# Patient Record
Sex: Female | Born: 1935 | Race: White | Hispanic: No | Smoking: Former smoker
Health system: Southern US, Community
[De-identification: ages and names within clinical notes are randomized; demographics above are authoritative.]

## PROBLEM LIST (undated history)

## (undated) DIAGNOSIS — E7800 Pure hypercholesterolemia, unspecified: Secondary | ICD-10-CM

## (undated) DIAGNOSIS — K219 Gastro-esophageal reflux disease without esophagitis: Secondary | ICD-10-CM

## (undated) DIAGNOSIS — E039 Hypothyroidism, unspecified: Secondary | ICD-10-CM

## (undated) HISTORY — PX: ABDOMINAL HYSTERECTOMY: SHX81

## (undated) HISTORY — DX: Hypothyroidism, unspecified: E03.9

## (undated) HISTORY — DX: Gastro-esophageal reflux disease without esophagitis: K21.9

## (undated) HISTORY — DX: Pure hypercholesterolemia, unspecified: E78.00

## (undated) HISTORY — PX: PARTIAL HYSTERECTOMY: SHX80

---

## 2001-12-15 ENCOUNTER — Ambulatory Visit (HOSPITAL_COMMUNITY): Admission: RE | Admit: 2001-12-15 | Discharge: 2001-12-15 | Payer: Self-pay | Admitting: Family Medicine

## 2001-12-15 ENCOUNTER — Encounter: Payer: Self-pay | Admitting: Family Medicine

## 2001-12-19 ENCOUNTER — Ambulatory Visit (HOSPITAL_COMMUNITY): Admission: RE | Admit: 2001-12-19 | Discharge: 2001-12-19 | Payer: Self-pay | Admitting: Family Medicine

## 2001-12-19 ENCOUNTER — Encounter: Payer: Self-pay | Admitting: Family Medicine

## 2002-06-29 ENCOUNTER — Ambulatory Visit (HOSPITAL_COMMUNITY): Admission: RE | Admit: 2002-06-29 | Discharge: 2002-06-29 | Payer: Self-pay | Admitting: General Surgery

## 2005-07-23 ENCOUNTER — Ambulatory Visit (HOSPITAL_COMMUNITY): Admission: RE | Admit: 2005-07-23 | Discharge: 2005-07-23 | Payer: Self-pay | Admitting: General Surgery

## 2010-09-14 ENCOUNTER — Telehealth (INDEPENDENT_AMBULATORY_CARE_PROVIDER_SITE_OTHER): Payer: Self-pay | Admitting: *Deleted

## 2010-09-14 DIAGNOSIS — Z86010 Personal history of colonic polyps: Secondary | ICD-10-CM

## 2010-09-14 DIAGNOSIS — Z800 Family history of malignant neoplasm of digestive organs: Secondary | ICD-10-CM

## 2010-09-14 MED ORDER — PEG-KCL-NACL-NASULF-NA ASC-C 100 G PO SOLR: 1.0000 | Freq: Once | ORAL | Status: DC

## 2010-09-14 NOTE — Telephone Encounter (Signed)
TCS sch'd 12/30/10 at 9:30 (8:30), movi prep instructions mailed

## 2010-12-30 ENCOUNTER — Ambulatory Visit: Admit: 2010-12-30 | Payer: Self-pay | Admitting: Internal Medicine

## 2010-12-30 SURGERY — COLONOSCOPY
Anesthesia: Moderate Sedation

## 2015-03-05 ENCOUNTER — Encounter (INDEPENDENT_AMBULATORY_CARE_PROVIDER_SITE_OTHER): Payer: Self-pay | Admitting: *Deleted

## 2015-04-10 ENCOUNTER — Ambulatory Visit (INDEPENDENT_AMBULATORY_CARE_PROVIDER_SITE_OTHER): Payer: Medicare HMO | Admitting: Internal Medicine

## 2015-04-10 ENCOUNTER — Other Ambulatory Visit (INDEPENDENT_AMBULATORY_CARE_PROVIDER_SITE_OTHER): Payer: Self-pay | Admitting: Internal Medicine

## 2015-04-10 ENCOUNTER — Encounter (INDEPENDENT_AMBULATORY_CARE_PROVIDER_SITE_OTHER): Payer: Self-pay | Admitting: Internal Medicine

## 2015-04-10 ENCOUNTER — Encounter (INDEPENDENT_AMBULATORY_CARE_PROVIDER_SITE_OTHER): Payer: Self-pay | Admitting: *Deleted

## 2015-04-10 VITALS — BP 140/70 | HR 72 | Temp 97.7°F | Ht 64.0 in | Wt 136.9 lb

## 2015-04-10 DIAGNOSIS — R1319 Other dysphagia: Secondary | ICD-10-CM

## 2015-04-10 DIAGNOSIS — E039 Hypothyroidism, unspecified: Secondary | ICD-10-CM | POA: Insufficient documentation

## 2015-04-10 DIAGNOSIS — K219 Gastro-esophageal reflux disease without esophagitis: Secondary | ICD-10-CM | POA: Diagnosis not present

## 2015-04-10 DIAGNOSIS — E7800 Pure hypercholesterolemia, unspecified: Secondary | ICD-10-CM | POA: Insufficient documentation

## 2015-04-10 DIAGNOSIS — E038 Other specified hypothyroidism: Secondary | ICD-10-CM

## 2015-04-10 MED ORDER — OMEPRAZOLE 20 MG PO CPDR: 20.0000 mg | DELAYED_RELEASE_CAPSULE | Freq: Every day | ORAL | Status: DC

## 2015-04-10 NOTE — Progress Notes (Addendum)
Subjective:    Patient ID: Tina Mcmillan, female    DOB: 07-Sep-1934, 81 y.o.   MRN: [id number]  HPI Referred by Dr. Quillian Quince for GERD. She tells me she has had reflux for a couple of years.  If she eats Citrus she will have acid reflux. Coffee also bothers her.  No chocolate since February.  Has been taking Omeprazole as needed which helped.  She is presently not taking Omeprazole.  She occasionally has dysphagia. She has trouble with cornbread . Her appetite is good. She has had weight loss. Last year she became sick with reflux.  She says she lost 19 pounds over a period of a year.  She says she likes the weight she lost. BM x 1 a day. No melena or BRRB.   She tells me her esophagus feels small.   11/27/2014 H and H 15.2 and 45.2, MCV 90. Platelet ct 282 Albumin 4.1, total bili 1.1, ALP 95, AST 19, ALT 10    Review of Systems Past Medical History  Diagnosis Date  . Hypothyroid   . GERD (gastroesophageal reflux disease)   . High cholesterol     Past Surgical History  Procedure Laterality Date  . Partial hysterectomy      in her 65s for endometriosis    Allergies  Allergen Reactions  . Nsaids     Messes up her stomach    No current outpatient prescriptions on file prior to visit.   No current facility-administered medications on file prior to visit.   Current Outpatient Prescriptions  Medication Sig Dispense Refill  . Biotin 1 MG CAPS Take by mouth 2 (two) times daily.    . Cholecalciferol (VITAMIN D-3 PO) Take 5,000 Units by mouth every morning.    . clobetasol cream (TEMOVATE) [id number] % Apply 1 application topically.    . cycloSPORINE (RESTASIS) 0.05 % ophthalmic emulsion 1 drop 2 (two) times daily.    . finasteride (PROSCAR) 5 MG tablet Take 2.5 mg by mouth daily. 1/2 tab daily    . levothyroxine (SYNTHROID, LEVOTHROID) 50 MCG tablet Take 50 mcg by mouth daily before breakfast.    . loratadine (CLARITIN) 10 MG tablet Take 10 mg by mouth daily.    . magnesium 30 MG  tablet Take by mouth 2 (two) times daily.    . multivitamin-lutein (OCUVITE-LUTEIN) CAPS capsule Take 1 capsule by mouth daily.    . Omega-3 Fatty Acids (FISH OIL TRIPLE STRENGTH PO) Take by mouth.    . pyridOXINE (B-6) 50 MG tablet Take 100 mg by mouth daily.    . Turmeric 500 MG CAPS Take by mouth 2 (two) times daily.    Marland Kitchen omeprazole (PRILOSEC) 20 MG capsule Take 1 capsule (20 mg total) by mouth daily. 90 capsule 3   No current facility-administered medications for this visit.        Objective:   Physical Exam Blood pressure 140/70, pulse 72, temperature 97.7 F (36.5 C), height 5\' 4"  (1.626 m), weight 136 lb 14.4 oz (62.097 kg). Alert and oriented. Skin warm and dry. Oral mucosa is moist.   . Sclera anicteric, conjunctivae is pink. Thyroid not enlarged. No cervical lymphadenopathy. Lungs clear. Heart regular rate and rhythm.  Abdomen is soft. Bowel sounds are positive. No hepatomegaly. No abdominal masses felt. No tenderness.  No edema to lower extremities.          Assessment & Plan:  GERD: Will start her on Omeprazole 20mg  daily.   Dysphagia: Stricture needs to  be ruled out. EGD/ED.   The risks and benefits such as perforation, bleeding, and infection were reviewed with the patient and is agreeable. GERD diet

## 2015-04-10 NOTE — Patient Instructions (Signed)
EGD/ED. The risks and benefits such as perforation, bleeding, and infection were reviewed with the patient and is agreeable. 

## 2015-05-08 ENCOUNTER — Encounter (HOSPITAL_COMMUNITY): Payer: Self-pay

## 2015-05-08 ENCOUNTER — Ambulatory Visit (HOSPITAL_COMMUNITY)
Admission: RE | Admit: 2015-05-08 | Discharge: 2015-05-08 | Disposition: A | Discharge status: Home / Self Care | Payer: Medicare HMO | Source: Ambulatory Visit | Attending: Internal Medicine | Admitting: Internal Medicine

## 2015-05-08 ENCOUNTER — Encounter (HOSPITAL_COMMUNITY)
Admission: RE | Disposition: A | Discharge status: Home / Self Care | Payer: Self-pay | Source: Ambulatory Visit | Attending: Internal Medicine

## 2015-05-08 DIAGNOSIS — K219 Gastro-esophageal reflux disease without esophagitis: Secondary | ICD-10-CM

## 2015-05-08 DIAGNOSIS — E7800 Pure hypercholesterolemia, unspecified: Secondary | ICD-10-CM | POA: Insufficient documentation

## 2015-05-08 DIAGNOSIS — K449 Diaphragmatic hernia without obstruction or gangrene: Secondary | ICD-10-CM

## 2015-05-08 DIAGNOSIS — K2990 Gastroduodenitis, unspecified, without bleeding: Secondary | ICD-10-CM

## 2015-05-08 DIAGNOSIS — Z87891 Personal history of nicotine dependence: Secondary | ICD-10-CM | POA: Diagnosis not present

## 2015-05-08 DIAGNOSIS — K2970 Gastritis, unspecified, without bleeding: Secondary | ICD-10-CM | POA: Insufficient documentation

## 2015-05-08 DIAGNOSIS — Z79899 Other long term (current) drug therapy: Secondary | ICD-10-CM | POA: Insufficient documentation

## 2015-05-08 DIAGNOSIS — K2960 Other gastritis without bleeding: Secondary | ICD-10-CM | POA: Diagnosis not present

## 2015-05-08 DIAGNOSIS — R1310 Dysphagia, unspecified: Secondary | ICD-10-CM | POA: Diagnosis not present

## 2015-05-08 DIAGNOSIS — R1319 Other dysphagia: Secondary | ICD-10-CM | POA: Insufficient documentation

## 2015-05-08 DIAGNOSIS — E039 Hypothyroidism, unspecified: Secondary | ICD-10-CM | POA: Diagnosis not present

## 2015-05-08 DIAGNOSIS — R1314 Dysphagia, pharyngoesophageal phase: Secondary | ICD-10-CM | POA: Diagnosis not present

## 2015-05-08 HISTORY — PX: ESOPHAGOGASTRODUODENOSCOPY: SHX5428

## 2015-05-08 HISTORY — PX: ESOPHAGEAL DILATION: SHX303

## 2015-05-08 SURGERY — EGD (ESOPHAGOGASTRODUODENOSCOPY)
Anesthesia: Moderate Sedation

## 2015-05-08 MED ORDER — MEPERIDINE HCL 50 MG/ML IJ SOLN
INTRAMUSCULAR | Status: AC
  Filled 2015-05-08: qty 1

## 2015-05-08 MED ORDER — BUTAMBEN-TETRACAINE-BENZOCAINE 2-2-14 % EX AERO
INHALATION_SPRAY | CUTANEOUS | Status: AC
  Filled 2015-05-08: qty 20

## 2015-05-08 MED ORDER — MEPERIDINE HCL 50 MG/ML IJ SOLN
INTRAMUSCULAR | Status: DC | PRN
  Administered 2015-05-08: 25 mg via INTRAVENOUS

## 2015-05-08 MED ORDER — STERILE WATER FOR IRRIGATION IR SOLN
Status: DC | PRN
  Administered 2015-05-08: 4 mL

## 2015-05-08 MED ORDER — MIDAZOLAM HCL 5 MG/5ML IJ SOLN
INTRAMUSCULAR | Status: AC
  Filled 2015-05-08: qty 10

## 2015-05-08 MED ORDER — SODIUM CHLORIDE 0.9 % IV SOLN
INTRAVENOUS | Status: DC
  Administered 2015-05-08: 09:00:00 via INTRAVENOUS

## 2015-05-08 MED ORDER — MIDAZOLAM HCL 5 MG/5ML IJ SOLN
INTRAMUSCULAR | Status: DC | PRN
  Administered 2015-05-08: 1 mg via INTRAVENOUS
  Administered 2015-05-08: 2 mg via INTRAVENOUS
  Administered 2015-05-08: 1 mg via INTRAVENOUS

## 2015-05-08 MED ORDER — BUTAMBEN-TETRACAINE-BENZOCAINE 2-2-14 % EX AERO
INHALATION_SPRAY | CUTANEOUS | Status: DC | PRN
  Administered 2015-05-08: 2 via TOPICAL

## 2015-05-08 NOTE — H&P (Signed)
Tina Mcmillan is an 81 y.o. female.   Chief Complaint: Patient is here for EGD and ED. HPI: 81 year old Caucasian female was at symptoms of GERD for about 3 years. She states that her symptoms started she started to lose weight and lost 18 pounds but her weight has been stable over the last 1 year. She now presents with dysphagia to bread and meats. She's had this intermittently for 1 year. She points to suprasternal area soft bolus obstruction. She has to bring it out before she can eat again. She denies nausea vomiting hematemesis abdominal pain or melena. Her upper GI tract has never been evaluated previously.  Past Medical History  Diagnosis Date  . Hypothyroid   . GERD (gastroesophageal reflux disease)   . High cholesterol     Past Surgical History  Procedure Laterality Date  . Partial hysterectomy      in her 77s for endometriosis  . Abdominal hysterectomy      History reviewed. No pertinent family history. Social History:  reports that she has quit smoking. She has never used smokeless tobacco. She reports that she does not drink alcohol or use illicit drugs.  Allergies:  Allergies  Allergen Reactions  . Nsaids     Messes up her stomach    Medications Prior to Admission  Medication Sig Dispense Refill  . AMINO ACIDS PO Take 1 packet by mouth daily.    . Cholecalciferol (VITAMIN D-3 PO) Take 5,000 Units by mouth every morning.    . clobetasol cream (TEMOVATE) [id number] % Apply 1 application topically.    . cycloSPORINE (RESTASIS) 0.05 % ophthalmic emulsion 1 drop 2 (two) times daily.    . finasteride (PROSCAR) 5 MG tablet Take 2.5 mg by mouth daily. 1/2 tab daily    . gabapentin (NEURONTIN) 100 MG capsule Take 100 mg by mouth at bedtime.    Marland Kitchen levothyroxine (SYNTHROID, LEVOTHROID) 50 MCG tablet Take 50 mcg by mouth daily before breakfast.    . loratadine (CLARITIN) 10 MG tablet Take 10 mg by mouth daily.    . magnesium 30 MG tablet Take by mouth 2 (two) times daily.    .  Multiple Vitamins-Minerals (HAIR SKIN AND NAILS FORMULA PO) Take 2 tablets by mouth daily.    . multivitamin-lutein (OCUVITE-LUTEIN) CAPS capsule Take 1 capsule by mouth daily.    . Omega-3 Fatty Acids (FISH OIL TRIPLE STRENGTH PO) Take by mouth.    Marland Kitchen omeprazole (PRILOSEC) 20 MG capsule Take 1 capsule (20 mg total) by mouth daily. 90 capsule 3  . pyridOXINE (B-6) 50 MG tablet Take 100 mg by mouth daily.    . Turmeric 500 MG CAPS Take by mouth 2 (two) times daily.      No results found for this or any previous visit (from the past 48 hour(s)). No results found.  ROS  Blood pressure 111/58, pulse 69, temperature 98.2 F (36.8 C), temperature source Oral, resp. rate 18, height 5\' 4"  (1.626 m), weight 133 lb (60.328 kg), SpO2 98 %. Physical Exam  Constitutional:  Well-developed thin Caucasian female in NAD.  HENT:  Mouth/Throat: Oropharynx is clear and moist.  Eyes: Conjunctivae are normal. No scleral icterus.  Neck: No thyromegaly present.  Cardiovascular: Normal rate, regular rhythm and normal heart sounds.   No murmur heard. Respiratory: Effort normal and breath sounds normal.  GI: Soft. She exhibits no distension and no mass. There is no tenderness.  Musculoskeletal: She exhibits no edema.  Lymphadenopathy:    She has  no cervical adenopathy.  Neurological: She is alert.  Skin: Skin is warm and dry.     Assessment/Plan Solid food dysphagia in patient with chronic GERD. EGD with ED.  Rogene Houston, MD 05/08/2015, 9:16 AM

## 2015-05-08 NOTE — Discharge Instructions (Signed)
Resume usual medications and diet. No driving for 24 hours. Physician will call with results of blood test.   Esophagogastroduodenoscopy, Care After Refer to this sheet in the next few weeks. These instructions provide you with information about caring for yourself after your procedure. Your health care provider may also give you more specific instructions. Your treatment has been planned according to current medical practices, but problems sometimes occur. Call your health care provider if you have any problems or questions after your procedure. WHAT TO EXPECT AFTER THE PROCEDURE After your procedure, it is typical to feel:  Soreness in your throat.  Pain with swallowing.  Sick to your stomach (nauseous).  Bloated.  Dizzy.  Fatigued. HOME CARE INSTRUCTIONS  Do not eat or drink anything until the numbing medicine (local anesthetic) has worn off and your gag reflex has returned. You will know that the local anesthetic has worn off when you can swallow comfortably.  Do not drive or operate machinery until directed by your health care provider.  Take medicines only as directed by your health care provider. SEEK MEDICAL CARE IF:   You cannot stop coughing.  You are not urinating at all or less than usual. SEEK IMMEDIATE MEDICAL CARE IF:  You have difficulty swallowing.  You cannot eat or drink.  You have worsening throat or chest pain.  You have dizziness or lightheadedness or you faint.  You have nausea or vomiting.  You have chills.  You have a fever.  You have severe abdominal pain.  You have black, tarry, or bloody stools.   This information is not intended to replace advice given to you by your health care provider. Make sure you discuss any questions you have with your health care provider.   Document Released: 01/05/2012 Document Revised: 02/08/2014 Document Reviewed: 01/05/2012 Elsevier Interactive Patient Education [employer].

## 2015-05-08 NOTE — Op Note (Signed)
[facility] Patient Name: Tina Mcmillan Procedure Date: 05/08/2015 8:53 AM MRN: [id number] Date of Birth: [birth year] Attending MD: Hildred Laser , MD CSN: [id number] Age: 81 Admit Type: Outpatient Procedure:                Upper GI endoscopy Indications:              Esophageal dysphagia, Gastro-esophageal reflux                            disease Providers:                Hildred Laser, MD, Rometta Emery, RN, Bonnetta Barry, Technician Referring MD:             Gar Ponto M.D. , MD (Referring MD) Medicines:                Cetacaine spray, Meperidine 25 mg IV, Midazolam 4                            mg IV Complications:            No immediate complications. Estimated Blood Loss:     Estimated blood loss: none. Procedure:                Pre-Anesthesia Assessment:                           - Prior to the procedure, a History and Physical                            was performed, and patient medications and                            allergies were reviewed. The patient's tolerance of                            previous anesthesia was also reviewed. The risks                            and benefits of the procedure and the sedation                            options and risks were discussed with the patient.                            All questions were answered, and informed consent                            was obtained. Prior Anticoagulants: The patient has                            taken no previous anticoagulant or antiplatelet  agents. ASA Grade Assessment: II - A patient with                            mild systemic disease. After reviewing the risks                            and benefits, the patient was deemed in                            satisfactory condition to undergo the procedure.                           After obtaining informed consent, the endoscope was                            passed under direct vision.  Throughout the                            procedure, the patient's blood pressure, pulse, and                            oxygen saturations were monitored continuously. The                            [id number]) scope was introduced through the                            mouth, and advanced to the second part of duodenum.                            The upper GI endoscopy was accomplished without                            difficulty. The patient tolerated the procedure                            well. Scope In: 9:26:48 AM Scope Out: 9:33:36 AM Total Procedure Duration: 0 hours 6 minutes 48 seconds  Findings:      The examined esophagus was normal.      The Z-line was regular and was found 39 cm from the incisors.      A 2 cm hiatal hernia was present.      The cardia, gastric fundus and gastric body were normal.      Patchy mild inflammation characterized by congestion (edema), erythema       and granularity was found in the gastric antrum.      The duodenal bulb and second portion of the duodenum were normal.      There is no endoscopic evidence of [Pertinent Negatives] [Site].      The scope was withdrawn. Dilation was performed in the entire esophagus       with a Maloney dilator with no resistance at 65 Fr. Impression:               - Normal esophagus.                           -  Z-line regular, 39 cm from the incisors.                           - 2 cm hiatal hernia.                           - Normal cardia, gastric fundus and gastric body.                           - Non-erosive gastritis.                           - Normal duodenal bulb and second portion of the                            duodenum.                           - Dilation performed in the entire esophagus.                           - No specimens collected. Moderate Sedation:      Moderate (conscious) sedation was administered by the endoscopy nurse       and supervised by the endoscopist. The following  parameters were       monitored: oxygen saturation, heart rate, blood pressure, CO2       capnography and response to care. Total physician intraservice time was       13 minutes. Recommendation:           - Patient has a contact number available for                            emergencies. The signs and symptoms of potential                            delayed complications were discussed with the                            patient. Return to normal activities tomorrow.                            Written discharge instructions were provided to the                            patient.                           - Resume previous diet today.                           - Continue present medications.                           - Perform an H. pylori serology today. Procedure Code(s):        --- Professional ---  [id number], Esophagogastroduodenoscopy, flexible,                            transoral; diagnostic, including collection of                            specimen(s) by brushing or washing, when performed                            (separate procedure)                           43450, Dilation of esophagus, by unguided sound or                            bougie, single or multiple passes                           99152, Moderate sedation services provided by the                            same physician or other qualified health care                            professional performing the diagnostic or                            therapeutic service that the sedation supports,                            requiring the presence of an independent trained                            observer to assist in the monitoring of the                            patient's level of consciousness and physiological                            status; initial 15 minutes of intraservice time,                            patient age 42 years or older Diagnosis Code(s):        --- Professional  ---                           K44.9, Diaphragmatic hernia without obstruction or                            gangrene                           K29.60, Other gastritis without bleeding                           R13.14, Dysphagia, pharyngoesophageal phase  K21.9, Gastro-esophageal reflux disease without                            esophagitis CPT copyright 2016 American Medical Association. All rights reserved. The codes documented in this report are preliminary and upon coder review may  be revised to meet current compliance requirements. Hildred Laser, MD Hildred Laser, MD 05/08/2015 9:49:19 AM This report has been signed electronically. Number of Addenda: 0

## 2015-05-09 LAB — H. PYLORI ANTIBODY, IGG: H Pylori IgG: 6.5 U/mL — ABNORMAL HIGH (ref 0.0–0.8)

## 2015-05-13 ENCOUNTER — Encounter (HOSPITAL_COMMUNITY): Payer: Self-pay | Admitting: Internal Medicine

## 2015-05-18 ENCOUNTER — Other Ambulatory Visit (INDEPENDENT_AMBULATORY_CARE_PROVIDER_SITE_OTHER): Payer: Self-pay | Admitting: Internal Medicine

## 2015-05-18 MED ORDER — BIS SUBCIT-METRONID-TETRACYC 140-125-125 MG PO CAPS: 3.0000 | ORAL_CAPSULE | Freq: Three times a day (TID) | ORAL | Status: DC

## 2015-05-20 ENCOUNTER — Telehealth (INDEPENDENT_AMBULATORY_CARE_PROVIDER_SITE_OTHER): Payer: Self-pay | Admitting: *Deleted

## 2015-05-20 NOTE — Telephone Encounter (Signed)
Patient was called and made aware that we had rec'd a PA from her Pharmacy. This will be discussed with Dr.Rehman and she will b called tomorrow with his recommendations.

## 2015-05-20 NOTE — Telephone Encounter (Signed)
Dr. Laural Golden had called in some mediation for her.  He told her to call should the medication be too expensive.  Her insurance will not pay any and it was [phone].  [phone]

## 2015-07-14 ENCOUNTER — Encounter (INDEPENDENT_AMBULATORY_CARE_PROVIDER_SITE_OTHER): Payer: Self-pay | Admitting: *Deleted

## 2015-08-04 ENCOUNTER — Other Ambulatory Visit (INDEPENDENT_AMBULATORY_CARE_PROVIDER_SITE_OTHER): Payer: Self-pay | Admitting: *Deleted

## 2015-08-04 DIAGNOSIS — Z1211 Encounter for screening for malignant neoplasm of colon: Secondary | ICD-10-CM

## 2015-08-04 DIAGNOSIS — Z800 Family history of malignant neoplasm of digestive organs: Secondary | ICD-10-CM

## 2015-08-20 ENCOUNTER — Encounter (INDEPENDENT_AMBULATORY_CARE_PROVIDER_SITE_OTHER): Payer: Self-pay | Admitting: *Deleted

## 2015-11-07 ENCOUNTER — Encounter (INDEPENDENT_AMBULATORY_CARE_PROVIDER_SITE_OTHER): Payer: Self-pay | Admitting: *Deleted

## 2015-11-07 ENCOUNTER — Telehealth (INDEPENDENT_AMBULATORY_CARE_PROVIDER_SITE_OTHER): Payer: Self-pay | Admitting: *Deleted

## 2015-11-07 MED ORDER — PEG 3350-KCL-NA BICARB-NACL 420 G PO SOLR: 4000.0000 mL | Freq: Once | ORAL | 0 refills | Status: AC

## 2015-11-07 NOTE — Telephone Encounter (Signed)
Patient needs trilyte 

## 2015-11-18 ENCOUNTER — Telehealth (INDEPENDENT_AMBULATORY_CARE_PROVIDER_SITE_OTHER): Payer: Self-pay | Admitting: *Deleted

## 2015-11-18 NOTE — Telephone Encounter (Signed)
Patient was diagnosed with H pylor back in April was treated with Pylera -- she states her symptoms have come back (reflux, burping and gas) she wants to know if she needs to be tested again -- please advise

## 2015-11-19 NOTE — Telephone Encounter (Signed)
This is for Dr. Rehman 

## 2015-11-19 NOTE — Telephone Encounter (Signed)
To be discussed with Dr.Rehman. 

## 2015-11-20 NOTE — Telephone Encounter (Signed)
Per Dr.Rehman - If the patient has not been on a PPI for 2 weeks will precede with breathe test. If the patient is taking her PPI , and feels that she can be off it for 2 weeks , a breathe test will be ordered. Patient call needed.

## 2015-12-01 ENCOUNTER — Telehealth (INDEPENDENT_AMBULATORY_CARE_PROVIDER_SITE_OTHER): Payer: Self-pay | Admitting: *Deleted

## 2015-12-01 NOTE — Telephone Encounter (Signed)
agree

## 2015-12-01 NOTE — Telephone Encounter (Signed)
Referring MD/PCP: daniel   Procedure: tcs  Reason/Indication:  Screening, fam hx colon ca  Has patient had this procedure before?  Yes, 10 yrs ago  If so, when, by whom and where?    Is there a family history of colon cancer?  Yes, maternal aunt  Who?  What age when diagnosed?    Is patient diabetic?   no      Does patient have prosthetic heart valve or mechanical valve?  no  Do you have a pacemaker?  no  Has patient ever had endocarditis? no  Has patient had joint replacement within last 12 months?  no  Does patient tend to be constipated or take laxatives? some  Does patient have a history of alcohol/drug use?  no  Is patient on Coumadin, Plavix and/or Aspirin? no  Medications: see epic  Allergies: Nsaids  Medication Adjustment:   Procedure date & time: 12/31/15 at 730

## 2015-12-31 ENCOUNTER — Ambulatory Visit (HOSPITAL_COMMUNITY)
Admission: RE | Admit: 2015-12-31 | Discharge: 2015-12-31 | Disposition: A | Discharge status: Home / Self Care | Payer: Medicare HMO | Source: Ambulatory Visit | Attending: Internal Medicine | Admitting: Internal Medicine

## 2015-12-31 ENCOUNTER — Encounter (HOSPITAL_COMMUNITY): Payer: Self-pay

## 2015-12-31 ENCOUNTER — Encounter (HOSPITAL_COMMUNITY)
Admission: RE | Disposition: A | Discharge status: Home / Self Care | Payer: Self-pay | Source: Ambulatory Visit | Attending: Internal Medicine

## 2015-12-31 ENCOUNTER — Other Ambulatory Visit (INDEPENDENT_AMBULATORY_CARE_PROVIDER_SITE_OTHER): Payer: Self-pay | Admitting: Internal Medicine

## 2015-12-31 DIAGNOSIS — Z886 Allergy status to analgesic agent status: Secondary | ICD-10-CM | POA: Insufficient documentation

## 2015-12-31 DIAGNOSIS — K219 Gastro-esophageal reflux disease without esophagitis: Secondary | ICD-10-CM | POA: Diagnosis not present

## 2015-12-31 DIAGNOSIS — Z1211 Encounter for screening for malignant neoplasm of colon: Secondary | ICD-10-CM

## 2015-12-31 DIAGNOSIS — Z9889 Other specified postprocedural states: Secondary | ICD-10-CM | POA: Insufficient documentation

## 2015-12-31 DIAGNOSIS — Z87891 Personal history of nicotine dependence: Secondary | ICD-10-CM | POA: Insufficient documentation

## 2015-12-31 DIAGNOSIS — E039 Hypothyroidism, unspecified: Secondary | ICD-10-CM | POA: Diagnosis not present

## 2015-12-31 DIAGNOSIS — K5730 Diverticulosis of large intestine without perforation or abscess without bleeding: Secondary | ICD-10-CM | POA: Insufficient documentation

## 2015-12-31 DIAGNOSIS — Z90710 Acquired absence of both cervix and uterus: Secondary | ICD-10-CM | POA: Insufficient documentation

## 2015-12-31 DIAGNOSIS — Z79899 Other long term (current) drug therapy: Secondary | ICD-10-CM | POA: Diagnosis not present

## 2015-12-31 DIAGNOSIS — E7800 Pure hypercholesterolemia, unspecified: Secondary | ICD-10-CM | POA: Diagnosis not present

## 2015-12-31 DIAGNOSIS — Z800 Family history of malignant neoplasm of digestive organs: Secondary | ICD-10-CM | POA: Insufficient documentation

## 2015-12-31 DIAGNOSIS — A048 Other specified bacterial intestinal infections: Secondary | ICD-10-CM

## 2015-12-31 HISTORY — PX: COLONOSCOPY: SHX5424

## 2015-12-31 SURGERY — COLONOSCOPY
Anesthesia: Moderate Sedation

## 2015-12-31 MED ORDER — MEPERIDINE HCL 50 MG/ML IJ SOLN
INTRAMUSCULAR | Status: AC
  Filled 2015-12-31: qty 1

## 2015-12-31 MED ORDER — SODIUM CHLORIDE 0.9 % IV SOLN
INTRAVENOUS | Status: DC
  Administered 2015-12-31: 07:00:00 via INTRAVENOUS

## 2015-12-31 MED ORDER — MIDAZOLAM HCL 5 MG/5ML IJ SOLN
INTRAMUSCULAR | Status: DC | PRN
  Administered 2015-12-31: 2 mg via INTRAVENOUS
  Administered 2015-12-31 (×2): 1 mg via INTRAVENOUS

## 2015-12-31 MED ORDER — STERILE WATER FOR IRRIGATION IR SOLN
Status: DC | PRN
  Administered 2015-12-31: 100 mL

## 2015-12-31 MED ORDER — MEPERIDINE HCL 50 MG/ML IJ SOLN
INTRAMUSCULAR | Status: DC | PRN
  Administered 2015-12-31 (×2): 25 mg via INTRAVENOUS

## 2015-12-31 MED ORDER — MIDAZOLAM HCL 5 MG/5ML IJ SOLN
INTRAMUSCULAR | Status: AC
  Filled 2015-12-31: qty 10

## 2015-12-31 NOTE — Discharge Instructions (Signed)
High-Fiber Diet Fiber, also called dietary fiber, is a type of carbohydrate found in fruits, vegetables, whole grains, and beans. A high-fiber diet can have many health benefits. Your health care provider may recommend a high-fiber diet to help:  Prevent constipation. Fiber can make your bowel movements more regular.  Lower your cholesterol.  Relieve hemorrhoids, uncomplicated diverticulosis, or irritable bowel syndrome.  Prevent overeating as part of a weight-loss plan.  Prevent heart disease, type 2 diabetes, and certain cancers. What is my plan? The recommended daily intake of fiber includes:  38 grams for men under age 6.  64 grams for men over age 17.  69 grams for women under age 52.  31 grams for women over age 10. You can get the recommended daily intake of dietary fiber by eating a variety of fruits, vegetables, grains, and beans. Your health care provider may also recommend a fiber supplement if it is not possible to get enough fiber through your diet. What do I need to know about a high-fiber diet?  Fiber supplements have not been widely studied for their effectiveness, so it is better to get fiber through food sources.  Always check the fiber content on thenutrition facts label of any prepackaged food. Look for foods that contain at least 5 grams of fiber per serving.  Ask your dietitian if you have questions about specific foods that are related to your condition, especially if those foods are not listed in the following section.  Increase your daily fiber consumption gradually. Increasing your intake of dietary fiber too quickly may cause bloating, cramping, or gas.  Drink plenty of water. Water helps you to digest fiber. What foods can I eat? Grains  Whole-grain breads. Multigrain cereal. Oats and oatmeal. Brown rice. Barley. Bulgur wheat. [place]. Bran muffins. Popcorn. Rye wafer crackers. Vegetables  Sweet potatoes. Spinach. Kale. Artichokes. Cabbage. Broccoli.  Green peas. Carrots. Squash. Fruits  Berries. Pears. Apples. Oranges. Avocados. Prunes and raisins. Dried figs. Meats and Other Protein Sources  [place], kidney, pinto, and soy beans. Split peas. Lentils. Nuts and seeds. Dairy  Fiber-fortified yogurt. Beverages  Fiber-fortified soy milk. Fiber-fortified orange juice. Other  Fiber bars. The items listed above may not be a complete list of recommended foods or beverages. Contact your dietitian for more options.  What foods are not recommended? Grains  White bread. Pasta made with refined flour. White rice. Vegetables  Fried potatoes. Canned vegetables. Well-cooked vegetables. Fruits  Fruit juice. Cooked, strained fruit. Meats and Other Protein Sources  Fatty cuts of meat. Fried Sales executive or fried fish. Dairy  Milk. Yogurt. Cream cheese. Sour cream. Beverages  Soft drinks. Other  Cakes and pastries. Butter and oils. The items listed above may not be a complete list of foods and beverages to avoid. Contact your dietitian for more information.  What are some tips for including high-fiber foods in my diet?  Eat a wide variety of high-fiber foods.  Make sure that half of all grains consumed each day are whole grains.  Replace breads and cereals made from refined flour or white flour with whole-grain breads and cereals.  Replace white rice with brown rice, bulgur wheat, or millet.  Start the day with a breakfast that is high in fiber, such as a cereal that contains at least 5 grams of fiber per serving.  Use beans in place of meat in soups, salads, or pasta.  Eat high-fiber snacks, such as berries, raw vegetables, nuts, or popcorn. This information is not intended to replace  advice given to you by your health care provider. Make sure you discuss any questions you have with your health care provider. Document Released: 01/18/2005 Document Revised: 06/26/2015 Document Reviewed: 07/03/2013 Elsevier Interactive Patient Education  2017  [place]. Colonoscopy, Adult, Care After This sheet gives you information about how to care for yourself after your procedure. Your health care provider may also give you more specific instructions. If you have problems or questions, contact your health care provider. What can I expect after the procedure? After the procedure, it is common to have:  A small amount of blood in your stool for 24 hours after the procedure.  Some gas.  Mild abdominal cramping or bloating. Follow these instructions at home: General instructions  For the first 24 hours after the procedure:  Do not drive or use machinery.  Do not sign important documents.  Do not drink alcohol.  Do your regular daily activities at a slower pace than normal.  Eat soft, easy-to-digest foods.  Rest often.  Take over-the-counter or prescription medicines only as told by your health care provider.  It is up to you to get the results of your procedure. Ask your health care provider, or the department performing the procedure, when your results will be ready. Relieving cramping and bloating  Try walking around when you have cramps or feel bloated.  Apply heat to your abdomen as told by your health care provider. Use a heat source that your health care provider recommends, such as a moist heat pack or a heating pad.  Place a towel between your skin and the heat source.  Leave the heat on for 20-30 minutes.  Remove the heat if your skin turns bright red. This is especially important if you are unable to feel pain, heat, or cold. You may have a greater risk of getting burned. Eating and drinking  Drink enough fluid to keep your urine clear or pale yellow.  Resume your normal diet as instructed by your health care provider. Avoid heavy or fried foods that are hard to digest.  Avoid drinking alcohol for as long as instructed by your health care provider. Contact a health care provider if:  You have blood in your  stool 2-3 days after the procedure. Get help right away if:  You have more than a small spotting of blood in your stool.  You pass large blood clots in your stool.  Your abdomen is swollen.  You have nausea or vomiting.  You have a fever.  You have increasing abdominal pain that is not relieved with medicine. This information is not intended to replace advice given to you by your health care provider. Make sure you discuss any questions you have with your health care provider. Document Released: 09/02/2003 Document Revised: 10/13/2015 Document Reviewed: 04/01/2015 Elsevier Interactive Patient Education  2017 [place] usual medications and high fiber diet. No driving for 24 hours.

## 2015-12-31 NOTE — Op Note (Signed)
[facility] Patient Name: Tina Mcmillan Procedure Date: 12/31/2015 7:36 AM MRN: [id number] Date of Birth: 08/01/1934 Attending MD: Hildred Laser , MD CSN: [id number] Age: 81 Admit Type: Outpatient Procedure:                Colonoscopy Indications:              Screening for colorectal malignant neoplasm Providers:                Hildred Laser, MD, Rosina Lowenstein, RN, Purcell Nails.                            Addis, Merchant [employer] officer Referring MD:             Mitzie Na. Quillian Quince, MD Medicines:                Meperidine 50 mg IV, Midazolam 4 mg IV Complications:            No immediate complications. Estimated Blood Loss:     Estimated blood loss: none. Procedure:                Pre-Anesthesia Assessment:                           - Prior to the procedure, a History and Physical                            was performed, and patient medications and                            allergies were reviewed. The patient's tolerance of                            previous anesthesia was also reviewed. The risks                            and benefits of the procedure and the sedation                            options and risks were discussed with the patient.                            All questions were answered, and informed consent                            was obtained. Prior Anticoagulants: The patient has                            taken no previous anticoagulant or antiplatelet                            agents. ASA Grade Assessment: II - A patient with                            mild systemic disease. After reviewing the risks  and benefits, the patient was deemed in                            satisfactory condition to undergo the procedure.                           After obtaining informed consent, the colonoscope                            was passed under direct vision. Throughout the                            procedure, the patient's blood pressure, pulse, and                             oxygen saturations were monitored continuously. The                            [id number] scope was introduced through                            the anus and advanced to the the cecum, identified                            by appendiceal orifice and ileocecal valve. The                            colonoscopy was performed without difficulty. The                            patient tolerated the procedure well. The quality                            of the bowel preparation was excellent. The                            ileocecal valve, appendiceal orifice, and rectum                            were photographed. Scope In: 7:43:16 AM Scope Out: 8:02:23 AM Scope Withdrawal Time: 0 hours 7 minutes 7 seconds  Total Procedure Duration: 0 hours 19 minutes 7 seconds  Findings:      A few medium-mouthed diverticula were found in the sigmoid colon.      The exam was otherwise normal throughout the examined colon.      The retroflexed view of the distal rectum and anal verge was normal and       showed no anal or rectal abnormalities. Impression:               - Diverticulosis in the sigmoid colon.                           - The distal rectum and anal verge are normal on  retroflexion view.                           - No specimens collected. Moderate Sedation:      Moderate (conscious) sedation was administered by the endoscopy nurse       and supervised by the endoscopist. The following parameters were       monitored: oxygen saturation, heart rate, blood pressure, CO2       capnography and response to care. Total physician intraservice time was       25 minutes. Recommendation:           - Patient has a contact number available for                            emergencies. The signs and symptoms of potential                            delayed complications were discussed with the                            patient. Return to normal activities  tomorrow.                            Written discharge instructions were provided to the                            patient.                           - High fiber diet today.                           - Continue present medications.                           - No repeat colonoscopy due to age and the absence                            of advanced adenomas. Procedure Code(s):        --- Professional ---                           [phone], Colonoscopy, flexible; diagnostic, including                            collection of specimen(s) by brushing or washing,                            when performed (separate procedure)                           [id number], Moderate sedation services provided by the                            same physician or other qualified health care  professional performing the diagnostic or                            therapeutic service that the sedation supports,                            requiring the presence of an independent trained                            observer to assist in the monitoring of the                            patient's level of consciousness and physiological                            status; initial 15 minutes of intraservice time,                            patient age 61 years or older                           [phone], Moderate sedation services; each additional                            15 minutes intraservice time Diagnosis Code(s):        --- Professional ---                           Z12.11, Encounter for screening for malignant                            neoplasm of colon                           K57.30, Diverticulosis of large intestine without                            perforation or abscess without bleeding CPT copyright 2016 American [employer] Association. All rights reserved. The codes documented in this report are preliminary and upon coder review may  be revised to meet current compliance  requirements. Hildred Laser, MD Hildred Laser, MD 12/31/2015 8:16:15 AM This report has been signed electronically. Number of Addenda: 0

## 2015-12-31 NOTE — H&P (Signed)
Tina Mcmillan is an 81 y.o. female.   Chief Complaint: Patient is here for colonoscopy. HPI: Patient is 81 year old Caucasian female who is here for screening colonoscopy. Last exam was about 10 years ago. She denies abdominal pain change in bowel habits or rectal bleeding. Family history significant for CRC in maternal aunt who was in her early 31s. She doesn't have first degree relative with CRC.   Past Medical History:  Diagnosis Date  . GERD (gastroesophageal reflux disease)   . High cholesterol   . Hypothyroid     Past Surgical History:  Procedure Laterality Date  . ABDOMINAL HYSTERECTOMY    . ESOPHAGEAL DILATION N/A 05/08/2015   Procedure: ESOPHAGEAL DILATION;  Surgeon: Rogene Houston, MD;  Location: [facility];  Service: Endoscopy;  Laterality: N/A;  . ESOPHAGOGASTRODUODENOSCOPY N/A 05/08/2015   Procedure: ESOPHAGOGASTRODUODENOSCOPY (EGD);  Surgeon: Rogene Houston, MD;  Location: [facility];  Service: Endoscopy;  Laterality: N/A;  9:00  . PARTIAL HYSTERECTOMY     in her 56s for endometriosis    History reviewed. No pertinent family history. Social History:  reports that she has quit smoking. She has never used smokeless tobacco. She reports that she does not drink alcohol or use drugs.  Allergies:  Allergies  Allergen Reactions  . Nsaids     Messes up her stomach    Medications Prior to Admission  Medication Sig Dispense Refill  . Carboxymethylcellulose Sodium (REFRESH PLUS OP) Place 1 drop into both eyes daily.    . Cholecalciferol (VITAMIN D-3 PO) Take 5,000 Units by mouth every morning.    . clobetasol cream (TEMOVATE) [id number] % Apply 1 application topically.    . cycloSPORINE (RESTASIS) 0.05 % ophthalmic emulsion Place 1 drop into both eyes 2 (two) times daily.     . finasteride (PROSCAR) 5 MG tablet Take 2.5 mg by mouth daily. 1/2 tab daily    . gabapentin (NEURONTIN) 100 MG capsule Take 100 mg by mouth at bedtime.    Marland Kitchen levothyroxine (SYNTHROID, LEVOTHROID) 50  MCG tablet Take 50 mcg by mouth daily before breakfast.    . loratadine (CLARITIN) 10 MG tablet Take 10 mg by mouth daily.    . magnesium 30 MG tablet Take 30 mg by mouth 2 (two) times daily.     . Multiple Vitamins-Minerals (HAIR SKIN AND NAILS FORMULA PO) Take 2 tablets by mouth daily.    . multivitamin-lutein (OCUVITE-LUTEIN) CAPS capsule Take 1 capsule by mouth daily.    . Omega-3 Fatty Acids (FISH OIL TRIPLE STRENGTH PO) Take 2 capsules by mouth daily.     . Turmeric 500 MG CAPS Take 1 capsule by mouth 3 (three) times daily.     Marland Kitchen VITAMIN K PO Take 1 tablet by mouth daily.    Marland Kitchen omeprazole (PRILOSEC) 20 MG capsule Take 1 capsule (20 mg total) by mouth daily. 90 capsule 3    No results found for this or any previous visit (from the past 48 hour(s)). No results found.  ROS  Blood pressure (!) 142/97, pulse 63, temperature 98.5 F (36.9 C), temperature source Oral, resp. rate 19, height 5\' 4"  (1.626 m), weight 130 lb (59 kg), SpO2 92 %. Physical Exam  Constitutional: She appears well-developed and well-nourished.  HENT:  Mouth/Throat: Oropharynx is clear and moist.  Eyes: Conjunctivae are normal. No scleral icterus.  Neck: No thyromegaly present.  Cardiovascular: Normal rate, regular rhythm and normal heart sounds.   No murmur heard. Respiratory: Effort normal and breath  sounds normal.  GI: Soft. She exhibits no distension and no mass. There is no tenderness.  Musculoskeletal: She exhibits no edema.  Lymphadenopathy:    She has no cervical adenopathy.  Neurological: She is alert.  Skin: Skin is warm and dry.     Assessment/Plan Average risk screening colonoscopy. CRC and second-degree relative.  Hildred Laser, MD 12/31/2015, 7:33 AM

## 2016-01-01 ENCOUNTER — Encounter (HOSPITAL_COMMUNITY)
Admit: 2016-01-01 | Discharge: 2016-01-01 | Disposition: A | Discharge status: Home / Self Care | Payer: Medicare HMO | Source: Ambulatory Visit | Attending: Internal Medicine | Admitting: Internal Medicine

## 2016-01-01 ENCOUNTER — Encounter (HOSPITAL_COMMUNITY): Payer: Self-pay

## 2016-01-01 DIAGNOSIS — A048 Other specified bacterial intestinal infections: Secondary | ICD-10-CM | POA: Diagnosis not present

## 2016-01-01 MED ORDER — CARBON-14 CAPSULE
1.0000 | ORAL_CAPSULE | Freq: Once | ORAL | Status: AC
  Administered 2016-01-01: 1 via ORAL

## 2016-01-02 ENCOUNTER — Encounter (HOSPITAL_COMMUNITY): Payer: Self-pay | Admitting: Internal Medicine

## 2016-02-05 DIAGNOSIS — H1851 Endothelial corneal dystrophy: Secondary | ICD-10-CM | POA: Diagnosis not present

## 2016-02-23 DIAGNOSIS — Z780 Asymptomatic menopausal state: Secondary | ICD-10-CM | POA: Diagnosis not present

## 2016-02-23 DIAGNOSIS — Z8262 Family history of osteoporosis: Secondary | ICD-10-CM | POA: Diagnosis not present

## 2016-02-23 DIAGNOSIS — Z79899 Other long term (current) drug therapy: Secondary | ICD-10-CM | POA: Diagnosis not present

## 2016-02-23 DIAGNOSIS — E039 Hypothyroidism, unspecified: Secondary | ICD-10-CM | POA: Diagnosis not present

## 2016-02-23 DIAGNOSIS — M85852 Other specified disorders of bone density and structure, left thigh: Secondary | ICD-10-CM | POA: Diagnosis not present

## 2016-02-23 DIAGNOSIS — M810 Age-related osteoporosis without current pathological fracture: Secondary | ICD-10-CM | POA: Diagnosis not present

## 2016-02-23 DIAGNOSIS — M1990 Unspecified osteoarthritis, unspecified site: Secondary | ICD-10-CM | POA: Diagnosis not present

## 2016-06-01 DIAGNOSIS — M859 Disorder of bone density and structure, unspecified: Secondary | ICD-10-CM | POA: Diagnosis not present

## 2016-06-01 DIAGNOSIS — L658 Other specified nonscarring hair loss: Secondary | ICD-10-CM | POA: Diagnosis not present

## 2016-06-01 DIAGNOSIS — F5104 Psychophysiologic insomnia: Secondary | ICD-10-CM | POA: Diagnosis not present

## 2016-06-01 DIAGNOSIS — R252 Cramp and spasm: Secondary | ICD-10-CM | POA: Diagnosis not present

## 2016-06-01 DIAGNOSIS — K222 Esophageal obstruction: Secondary | ICD-10-CM | POA: Diagnosis not present

## 2016-06-01 DIAGNOSIS — E038 Other specified hypothyroidism: Secondary | ICD-10-CM | POA: Diagnosis not present

## 2016-06-01 DIAGNOSIS — E784 Other hyperlipidemia: Secondary | ICD-10-CM | POA: Diagnosis not present

## 2016-06-01 DIAGNOSIS — R209 Unspecified disturbances of skin sensation: Secondary | ICD-10-CM | POA: Diagnosis not present

## 2016-06-01 DIAGNOSIS — K219 Gastro-esophageal reflux disease without esophagitis: Secondary | ICD-10-CM | POA: Diagnosis not present

## 2016-07-20 DIAGNOSIS — L570 Actinic keratosis: Secondary | ICD-10-CM | POA: Diagnosis not present

## 2016-07-20 DIAGNOSIS — L659 Nonscarring hair loss, unspecified: Secondary | ICD-10-CM | POA: Diagnosis not present

## 2016-07-20 DIAGNOSIS — Z85828 Personal history of other malignant neoplasm of skin: Secondary | ICD-10-CM | POA: Diagnosis not present

## 2016-09-17 DIAGNOSIS — H16223 Keratoconjunctivitis sicca, not specified as Sjogren's, bilateral: Secondary | ICD-10-CM | POA: Diagnosis not present

## 2016-09-17 DIAGNOSIS — Z961 Presence of intraocular lens: Secondary | ICD-10-CM | POA: Diagnosis not present

## 2016-10-18 DIAGNOSIS — Z961 Presence of intraocular lens: Secondary | ICD-10-CM | POA: Diagnosis not present

## 2016-10-18 DIAGNOSIS — H16223 Keratoconjunctivitis sicca, not specified as Sjogren's, bilateral: Secondary | ICD-10-CM | POA: Diagnosis not present

## 2016-11-03 DIAGNOSIS — Z23 Encounter for immunization: Secondary | ICD-10-CM | POA: Diagnosis not present

## 2016-11-15 DIAGNOSIS — H26493 Other secondary cataract, bilateral: Secondary | ICD-10-CM | POA: Diagnosis not present

## 2016-11-15 DIAGNOSIS — H1851 Endothelial corneal dystrophy: Secondary | ICD-10-CM | POA: Diagnosis not present

## 2016-11-15 DIAGNOSIS — H01029 Squamous blepharitis unspecified eye, unspecified eyelid: Secondary | ICD-10-CM | POA: Diagnosis not present

## 2016-11-15 DIAGNOSIS — Z961 Presence of intraocular lens: Secondary | ICD-10-CM | POA: Diagnosis not present

## 2016-11-15 DIAGNOSIS — H16223 Keratoconjunctivitis sicca, not specified as Sjogren's, bilateral: Secondary | ICD-10-CM | POA: Diagnosis not present

## 2016-11-30 DIAGNOSIS — Z6822 Body mass index (BMI) 22.0-22.9, adult: Secondary | ICD-10-CM | POA: Diagnosis not present

## 2016-11-30 DIAGNOSIS — F5104 Psychophysiologic insomnia: Secondary | ICD-10-CM | POA: Diagnosis not present

## 2016-11-30 DIAGNOSIS — E7849 Other hyperlipidemia: Secondary | ICD-10-CM | POA: Diagnosis not present

## 2016-11-30 DIAGNOSIS — G6289 Other specified polyneuropathies: Secondary | ICD-10-CM | POA: Diagnosis not present

## 2016-11-30 DIAGNOSIS — M859 Disorder of bone density and structure, unspecified: Secondary | ICD-10-CM | POA: Diagnosis not present

## 2016-11-30 DIAGNOSIS — K222 Esophageal obstruction: Secondary | ICD-10-CM | POA: Diagnosis not present

## 2016-11-30 DIAGNOSIS — E038 Other specified hypothyroidism: Secondary | ICD-10-CM | POA: Diagnosis not present

## 2016-11-30 DIAGNOSIS — K219 Gastro-esophageal reflux disease without esophagitis: Secondary | ICD-10-CM | POA: Diagnosis not present

## 2016-12-02 ENCOUNTER — Other Ambulatory Visit: Payer: Self-pay | Admitting: Endocrinology

## 2016-12-02 DIAGNOSIS — Z1231 Encounter for screening mammogram for malignant neoplasm of breast: Secondary | ICD-10-CM

## 2016-12-06 ENCOUNTER — Ambulatory Visit
Admission: RE | Admit: 2016-12-06 | Discharge: 2016-12-06 | Disposition: A | Discharge status: Home / Self Care | Payer: Medicare HMO | Source: Ambulatory Visit | Attending: Endocrinology | Admitting: Endocrinology

## 2016-12-06 DIAGNOSIS — Z1231 Encounter for screening mammogram for malignant neoplasm of breast: Secondary | ICD-10-CM | POA: Diagnosis not present

## 2017-05-25 DIAGNOSIS — Z131 Encounter for screening for diabetes mellitus: Secondary | ICD-10-CM | POA: Diagnosis not present

## 2017-05-25 DIAGNOSIS — R82998 Other abnormal findings in urine: Secondary | ICD-10-CM | POA: Diagnosis not present

## 2017-05-25 DIAGNOSIS — M859 Disorder of bone density and structure, unspecified: Secondary | ICD-10-CM | POA: Diagnosis not present

## 2017-05-25 DIAGNOSIS — E038 Other specified hypothyroidism: Secondary | ICD-10-CM | POA: Diagnosis not present

## 2017-06-01 DIAGNOSIS — Z0000 Encounter for general adult medical examination without abnormal findings: Secondary | ICD-10-CM | POA: Diagnosis not present

## 2017-06-01 DIAGNOSIS — F5104 Psychophysiologic insomnia: Secondary | ICD-10-CM | POA: Diagnosis not present

## 2017-06-01 DIAGNOSIS — K222 Esophageal obstruction: Secondary | ICD-10-CM | POA: Diagnosis not present

## 2017-06-01 DIAGNOSIS — E038 Other specified hypothyroidism: Secondary | ICD-10-CM | POA: Diagnosis not present

## 2017-06-01 DIAGNOSIS — J309 Allergic rhinitis, unspecified: Secondary | ICD-10-CM | POA: Diagnosis not present

## 2017-06-01 DIAGNOSIS — E7849 Other hyperlipidemia: Secondary | ICD-10-CM | POA: Diagnosis not present

## 2017-06-01 DIAGNOSIS — K219 Gastro-esophageal reflux disease without esophagitis: Secondary | ICD-10-CM | POA: Diagnosis not present

## 2017-06-01 DIAGNOSIS — R7301 Impaired fasting glucose: Secondary | ICD-10-CM | POA: Diagnosis not present

## 2017-06-01 DIAGNOSIS — M8580 Other specified disorders of bone density and structure, unspecified site: Secondary | ICD-10-CM | POA: Diagnosis not present

## 2017-06-03 DIAGNOSIS — Z1212 Encounter for screening for malignant neoplasm of rectum: Secondary | ICD-10-CM | POA: Diagnosis not present

## 2017-07-12 DIAGNOSIS — D2371 Other benign neoplasm of skin of right lower limb, including hip: Secondary | ICD-10-CM | POA: Diagnosis not present

## 2017-07-12 DIAGNOSIS — L821 Other seborrheic keratosis: Secondary | ICD-10-CM | POA: Diagnosis not present

## 2017-07-12 DIAGNOSIS — D1801 Hemangioma of skin and subcutaneous tissue: Secondary | ICD-10-CM | POA: Diagnosis not present

## 2017-07-12 DIAGNOSIS — L570 Actinic keratosis: Secondary | ICD-10-CM | POA: Diagnosis not present

## 2017-09-29 DIAGNOSIS — H5203 Hypermetropia, bilateral: Secondary | ICD-10-CM | POA: Diagnosis not present

## 2017-09-29 DIAGNOSIS — H1851 Endothelial corneal dystrophy: Secondary | ICD-10-CM | POA: Diagnosis not present

## 2017-09-29 DIAGNOSIS — Z961 Presence of intraocular lens: Secondary | ICD-10-CM | POA: Diagnosis not present

## 2017-09-29 DIAGNOSIS — H04123 Dry eye syndrome of bilateral lacrimal glands: Secondary | ICD-10-CM | POA: Diagnosis not present

## 2017-10-11 DIAGNOSIS — M545 Low back pain: Secondary | ICD-10-CM | POA: Diagnosis not present

## 2017-10-11 DIAGNOSIS — Z6823 Body mass index (BMI) 23.0-23.9, adult: Secondary | ICD-10-CM | POA: Diagnosis not present

## 2017-10-14 ENCOUNTER — Other Ambulatory Visit: Payer: Self-pay | Admitting: Endocrinology

## 2017-10-14 ENCOUNTER — Other Ambulatory Visit: Payer: Self-pay | Admitting: Registered Nurse

## 2017-10-14 DIAGNOSIS — M545 Low back pain: Secondary | ICD-10-CM

## 2017-10-21 ENCOUNTER — Inpatient Hospital Stay
Admission: RE | Admit: 2017-10-21 | Discharge: 2017-10-21 | Disposition: A | Discharge status: Home / Self Care | Payer: Medicare HMO | Source: Ambulatory Visit | Attending: Endocrinology | Admitting: Endocrinology

## 2017-10-26 DIAGNOSIS — R278 Other lack of coordination: Secondary | ICD-10-CM | POA: Diagnosis not present

## 2017-10-26 DIAGNOSIS — M545 Low back pain: Secondary | ICD-10-CM | POA: Diagnosis not present

## 2017-10-26 DIAGNOSIS — M6281 Muscle weakness (generalized): Secondary | ICD-10-CM | POA: Diagnosis not present

## 2017-10-28 DIAGNOSIS — R278 Other lack of coordination: Secondary | ICD-10-CM | POA: Diagnosis not present

## 2017-10-28 DIAGNOSIS — M545 Low back pain: Secondary | ICD-10-CM | POA: Diagnosis not present

## 2017-10-28 DIAGNOSIS — M6281 Muscle weakness (generalized): Secondary | ICD-10-CM | POA: Diagnosis not present

## 2017-11-01 DIAGNOSIS — R278 Other lack of coordination: Secondary | ICD-10-CM | POA: Diagnosis not present

## 2017-11-01 DIAGNOSIS — M545 Low back pain: Secondary | ICD-10-CM | POA: Diagnosis not present

## 2017-11-01 DIAGNOSIS — M6281 Muscle weakness (generalized): Secondary | ICD-10-CM | POA: Diagnosis not present

## 2017-11-03 DIAGNOSIS — M6281 Muscle weakness (generalized): Secondary | ICD-10-CM | POA: Diagnosis not present

## 2017-11-03 DIAGNOSIS — R278 Other lack of coordination: Secondary | ICD-10-CM | POA: Diagnosis not present

## 2017-11-03 DIAGNOSIS — M545 Low back pain: Secondary | ICD-10-CM | POA: Diagnosis not present

## 2017-11-10 DIAGNOSIS — M6281 Muscle weakness (generalized): Secondary | ICD-10-CM | POA: Diagnosis not present

## 2017-11-10 DIAGNOSIS — R278 Other lack of coordination: Secondary | ICD-10-CM | POA: Diagnosis not present

## 2017-11-10 DIAGNOSIS — M545 Low back pain: Secondary | ICD-10-CM | POA: Diagnosis not present

## 2017-11-16 ENCOUNTER — Other Ambulatory Visit: Payer: Self-pay | Admitting: Endocrinology

## 2017-11-16 DIAGNOSIS — Z1231 Encounter for screening mammogram for malignant neoplasm of breast: Secondary | ICD-10-CM

## 2017-11-28 DIAGNOSIS — R7301 Impaired fasting glucose: Secondary | ICD-10-CM | POA: Diagnosis not present

## 2017-11-28 DIAGNOSIS — K222 Esophageal obstruction: Secondary | ICD-10-CM | POA: Diagnosis not present

## 2017-11-28 DIAGNOSIS — K219 Gastro-esophageal reflux disease without esophagitis: Secondary | ICD-10-CM | POA: Diagnosis not present

## 2017-11-28 DIAGNOSIS — E038 Other specified hypothyroidism: Secondary | ICD-10-CM | POA: Diagnosis not present

## 2017-11-28 DIAGNOSIS — E7849 Other hyperlipidemia: Secondary | ICD-10-CM | POA: Diagnosis not present

## 2017-11-28 DIAGNOSIS — G6289 Other specified polyneuropathies: Secondary | ICD-10-CM | POA: Diagnosis not present

## 2017-11-28 DIAGNOSIS — M545 Low back pain: Secondary | ICD-10-CM | POA: Diagnosis not present

## 2017-11-28 DIAGNOSIS — M859 Disorder of bone density and structure, unspecified: Secondary | ICD-10-CM | POA: Diagnosis not present

## 2017-11-28 DIAGNOSIS — Z6823 Body mass index (BMI) 23.0-23.9, adult: Secondary | ICD-10-CM | POA: Diagnosis not present

## 2017-12-07 DIAGNOSIS — R05 Cough: Secondary | ICD-10-CM | POA: Diagnosis not present

## 2017-12-07 DIAGNOSIS — Z6823 Body mass index (BMI) 23.0-23.9, adult: Secondary | ICD-10-CM | POA: Diagnosis not present

## 2017-12-07 DIAGNOSIS — K219 Gastro-esophageal reflux disease without esophagitis: Secondary | ICD-10-CM | POA: Diagnosis not present

## 2017-12-22 ENCOUNTER — Ambulatory Visit
Admission: RE | Admit: 2017-12-22 | Discharge: 2017-12-22 | Disposition: A | Discharge status: Home / Self Care | Payer: Medicare HMO | Source: Ambulatory Visit | Attending: Endocrinology | Admitting: Endocrinology

## 2017-12-22 DIAGNOSIS — Z1231 Encounter for screening mammogram for malignant neoplasm of breast: Secondary | ICD-10-CM

## 2018-01-17 DIAGNOSIS — K219 Gastro-esophageal reflux disease without esophagitis: Secondary | ICD-10-CM | POA: Diagnosis not present

## 2018-01-17 DIAGNOSIS — R05 Cough: Secondary | ICD-10-CM | POA: Diagnosis not present

## 2018-01-17 DIAGNOSIS — Z6823 Body mass index (BMI) 23.0-23.9, adult: Secondary | ICD-10-CM | POA: Diagnosis not present

## 2018-01-17 DIAGNOSIS — M545 Low back pain: Secondary | ICD-10-CM | POA: Diagnosis not present

## 2018-01-26 ENCOUNTER — Ambulatory Visit
Admission: RE | Admit: 2018-01-26 | Discharge: 2018-01-26 | Disposition: A | Discharge status: Home / Self Care | Payer: Medicare HMO | Source: Ambulatory Visit | Attending: Endocrinology | Admitting: Endocrinology

## 2018-01-26 DIAGNOSIS — M545 Low back pain, unspecified: Secondary | ICD-10-CM

## 2018-01-26 DIAGNOSIS — M5126 Other intervertebral disc displacement, lumbar region: Secondary | ICD-10-CM | POA: Diagnosis not present

## 2018-03-10 DIAGNOSIS — K219 Gastro-esophageal reflux disease without esophagitis: Secondary | ICD-10-CM | POA: Diagnosis not present

## 2018-03-10 DIAGNOSIS — Z6823 Body mass index (BMI) 23.0-23.9, adult: Secondary | ICD-10-CM | POA: Diagnosis not present

## 2018-03-10 DIAGNOSIS — M859 Disorder of bone density and structure, unspecified: Secondary | ICD-10-CM | POA: Diagnosis not present

## 2018-05-19 DIAGNOSIS — M8580 Other specified disorders of bone density and structure, unspecified site: Secondary | ICD-10-CM | POA: Diagnosis not present

## 2018-05-19 DIAGNOSIS — J309 Allergic rhinitis, unspecified: Secondary | ICD-10-CM | POA: Diagnosis not present

## 2018-05-19 DIAGNOSIS — K219 Gastro-esophageal reflux disease without esophagitis: Secondary | ICD-10-CM | POA: Diagnosis not present

## 2018-05-19 DIAGNOSIS — E785 Hyperlipidemia, unspecified: Secondary | ICD-10-CM | POA: Diagnosis not present

## 2018-05-19 DIAGNOSIS — M545 Low back pain: Secondary | ICD-10-CM | POA: Diagnosis not present

## 2018-05-19 DIAGNOSIS — K5900 Constipation, unspecified: Secondary | ICD-10-CM | POA: Diagnosis not present

## 2018-05-19 DIAGNOSIS — E039 Hypothyroidism, unspecified: Secondary | ICD-10-CM | POA: Diagnosis not present

## 2018-05-19 DIAGNOSIS — R7301 Impaired fasting glucose: Secondary | ICD-10-CM | POA: Diagnosis not present

## 2018-05-19 DIAGNOSIS — F5104 Psychophysiologic insomnia: Secondary | ICD-10-CM | POA: Diagnosis not present

## 2018-07-26 DIAGNOSIS — L661 Lichen planopilaris: Secondary | ICD-10-CM | POA: Diagnosis not present

## 2018-07-26 DIAGNOSIS — L659 Nonscarring hair loss, unspecified: Secondary | ICD-10-CM | POA: Diagnosis not present

## 2018-07-26 DIAGNOSIS — L821 Other seborrheic keratosis: Secondary | ICD-10-CM | POA: Diagnosis not present

## 2018-07-27 DIAGNOSIS — Z961 Presence of intraocular lens: Secondary | ICD-10-CM | POA: Diagnosis not present

## 2018-07-27 DIAGNOSIS — H524 Presbyopia: Secondary | ICD-10-CM | POA: Diagnosis not present

## 2018-07-27 DIAGNOSIS — H35372 Puckering of macula, left eye: Secondary | ICD-10-CM | POA: Diagnosis not present

## 2018-07-27 DIAGNOSIS — H1851 Endothelial corneal dystrophy: Secondary | ICD-10-CM | POA: Diagnosis not present

## 2018-09-14 DIAGNOSIS — Z1159 Encounter for screening for other viral diseases: Secondary | ICD-10-CM | POA: Diagnosis not present

## 2018-09-22 DIAGNOSIS — J309 Allergic rhinitis, unspecified: Secondary | ICD-10-CM | POA: Diagnosis not present

## 2018-09-22 DIAGNOSIS — M8580 Other specified disorders of bone density and structure, unspecified site: Secondary | ICD-10-CM | POA: Diagnosis not present

## 2018-09-22 DIAGNOSIS — R7301 Impaired fasting glucose: Secondary | ICD-10-CM | POA: Diagnosis not present

## 2018-09-22 DIAGNOSIS — F5104 Psychophysiologic insomnia: Secondary | ICD-10-CM | POA: Diagnosis not present

## 2018-09-22 DIAGNOSIS — M545 Low back pain: Secondary | ICD-10-CM | POA: Diagnosis not present

## 2018-09-22 DIAGNOSIS — E039 Hypothyroidism, unspecified: Secondary | ICD-10-CM | POA: Diagnosis not present

## 2018-09-22 DIAGNOSIS — K219 Gastro-esophageal reflux disease without esophagitis: Secondary | ICD-10-CM | POA: Diagnosis not present

## 2018-09-22 DIAGNOSIS — K222 Esophageal obstruction: Secondary | ICD-10-CM | POA: Diagnosis not present

## 2018-09-28 DIAGNOSIS — Z23 Encounter for immunization: Secondary | ICD-10-CM | POA: Diagnosis not present

## 2018-11-08 DIAGNOSIS — H02883 Meibomian gland dysfunction of right eye, unspecified eyelid: Secondary | ICD-10-CM | POA: Diagnosis not present

## 2018-11-08 DIAGNOSIS — H04123 Dry eye syndrome of bilateral lacrimal glands: Secondary | ICD-10-CM | POA: Diagnosis not present

## 2018-11-08 DIAGNOSIS — Z961 Presence of intraocular lens: Secondary | ICD-10-CM | POA: Diagnosis not present

## 2018-11-08 DIAGNOSIS — H18519 Endothelial corneal dystrophy, unspecified eye: Secondary | ICD-10-CM | POA: Diagnosis not present

## 2018-11-08 DIAGNOSIS — H02886 Meibomian gland dysfunction of left eye, unspecified eyelid: Secondary | ICD-10-CM | POA: Diagnosis not present

## 2018-12-20 ENCOUNTER — Other Ambulatory Visit: Payer: Self-pay | Admitting: Endocrinology

## 2018-12-20 DIAGNOSIS — H04123 Dry eye syndrome of bilateral lacrimal glands: Secondary | ICD-10-CM | POA: Diagnosis not present

## 2018-12-20 DIAGNOSIS — H18513 Endothelial corneal dystrophy, bilateral: Secondary | ICD-10-CM | POA: Diagnosis not present

## 2018-12-20 DIAGNOSIS — Z961 Presence of intraocular lens: Secondary | ICD-10-CM | POA: Diagnosis not present

## 2018-12-20 DIAGNOSIS — Z1231 Encounter for screening mammogram for malignant neoplasm of breast: Secondary | ICD-10-CM

## 2018-12-20 DIAGNOSIS — H02886 Meibomian gland dysfunction of left eye, unspecified eyelid: Secondary | ICD-10-CM | POA: Diagnosis not present

## 2018-12-20 DIAGNOSIS — H02883 Meibomian gland dysfunction of right eye, unspecified eyelid: Secondary | ICD-10-CM | POA: Diagnosis not present

## 2019-02-16 ENCOUNTER — Ambulatory Visit
Admission: RE | Admit: 2019-02-16 | Discharge: 2019-02-16 | Disposition: A | Discharge status: Home / Self Care | Payer: Medicare Other | Source: Ambulatory Visit | Attending: Endocrinology | Admitting: Endocrinology

## 2019-02-16 ENCOUNTER — Other Ambulatory Visit: Payer: Self-pay

## 2019-02-16 DIAGNOSIS — Z1231 Encounter for screening mammogram for malignant neoplasm of breast: Secondary | ICD-10-CM

## 2019-02-16 IMAGING — MR MR LUMBAR SPINE W/O CM
5 series · 32 of 48 positions shown · non-contrast
Comparison: None.

CLINICAL DATA: Chronic low back pain for 1 year.

EXAM:
MRI LUMBAR SPINE WITHOUT CONTRAST
TECHNIQUE: Multiplanar, multisequence MR imaging of the lumbar spine was
performed. No intravenous contrast was administered.

[Series 3: T2 post-contrast · sagittal · 4.0mm · 0.88mm/px · 6 of 12 slices shown]
[im 1/12]
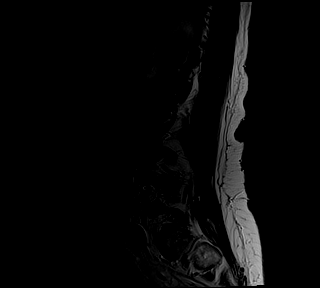
[im 3/12]
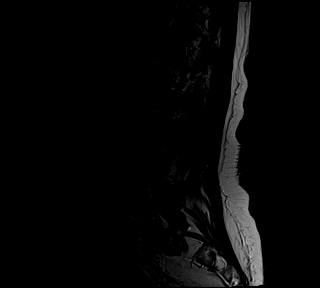
[im 5/12]
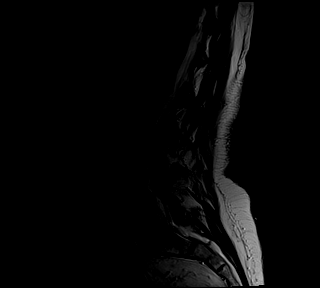
[im 7/12]
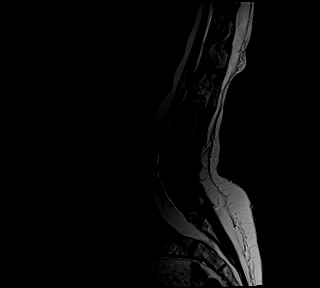
[im 9/12]
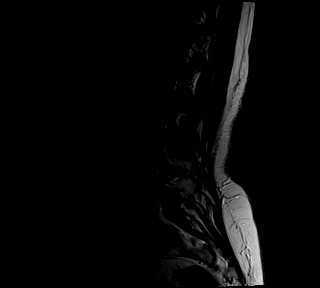
[im 12/12]
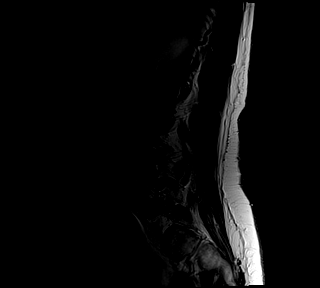

[Series 4: T1 · sagittal · 4.0mm · 0.88mm/px · 6 of 12 slices shown (1 of 2)]
[im 1/12]
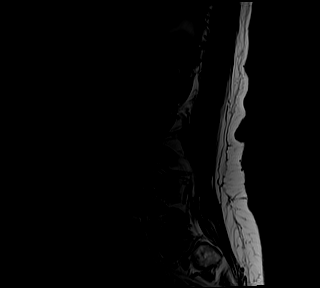
[im 3/12]
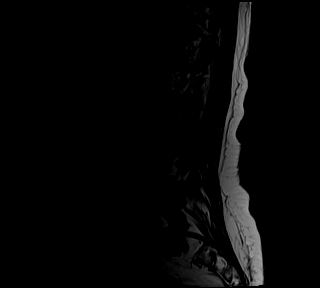
[im 5/12]
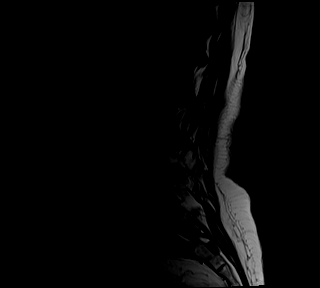
[im 7/12]
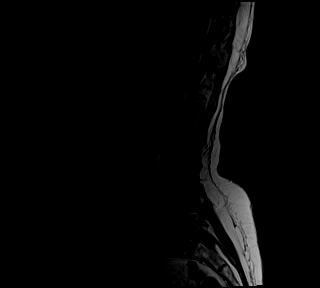
[im 9/12]
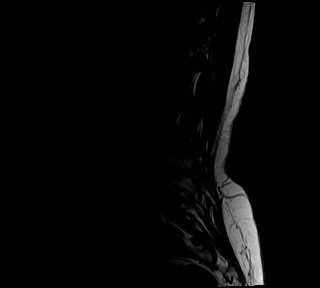
[im 12/12]
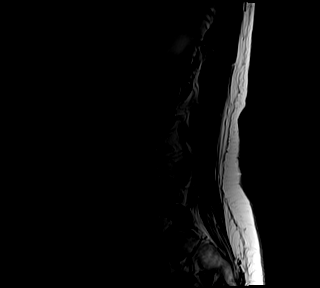

[Series 5: T1 · axial · 4.0mm · 0.37mm/px · z∈[-51,+173]mm · 9 of 32 slices shown (2 of 2)]
[im 1/32]
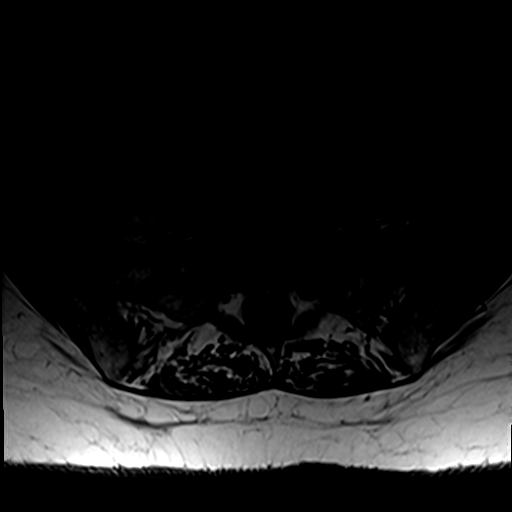
[im 5/32]
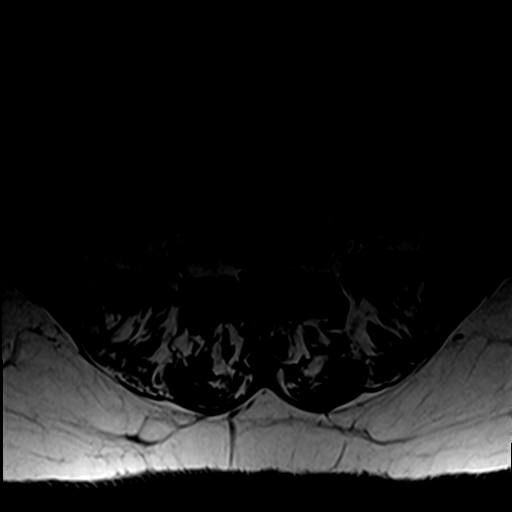
[im 9/32]
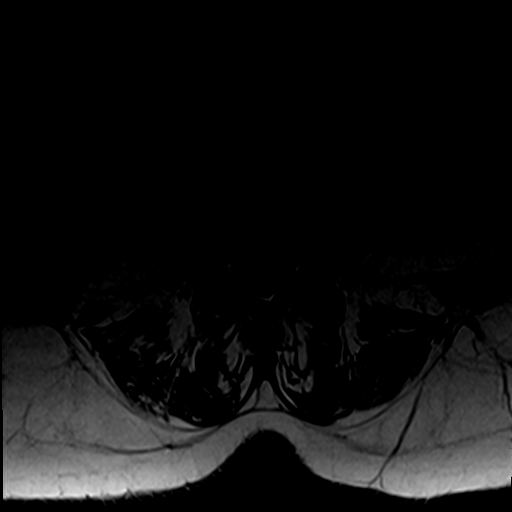
[im 14/32]
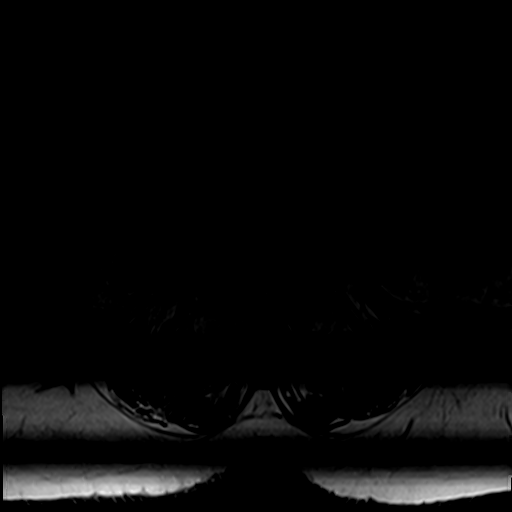
[im 16/32]
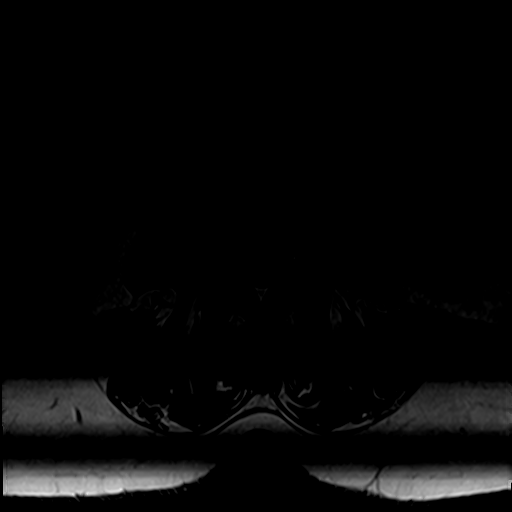
[im 18/32]
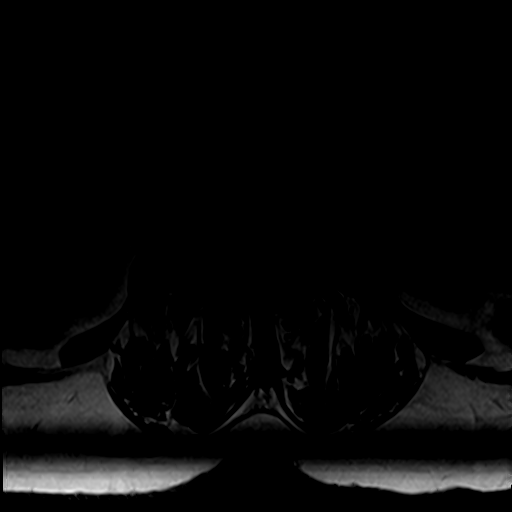
[im 23/32]
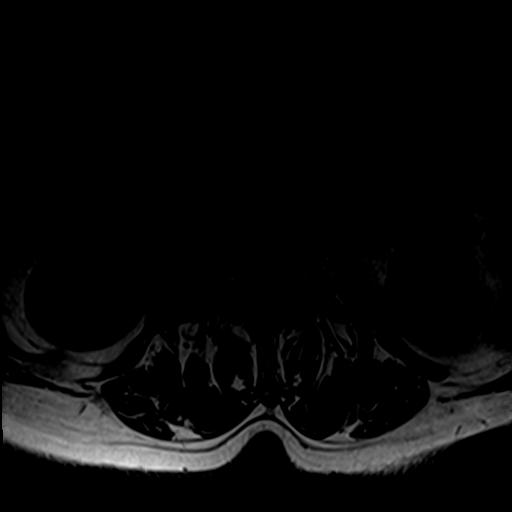
[im 27/32]
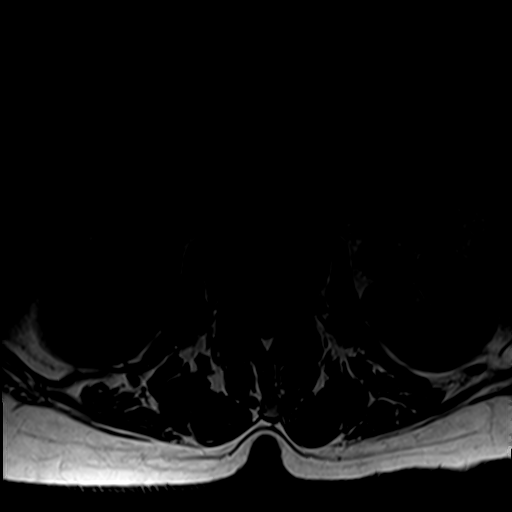
[im 32/32]
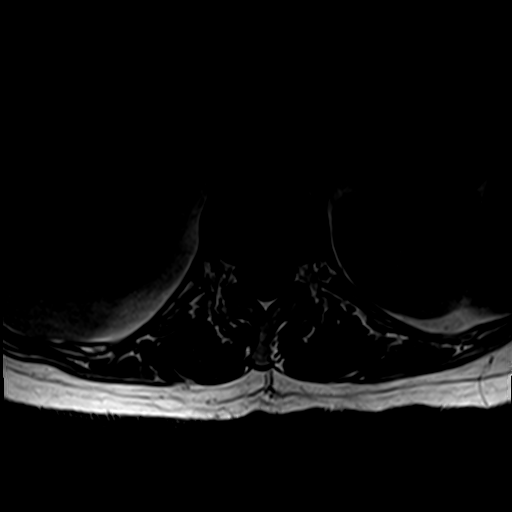

[Series 6: STIR · sagittal · 4.0mm · 0.55mm/px · 2 of 12 slices shown]
[im 1/12]
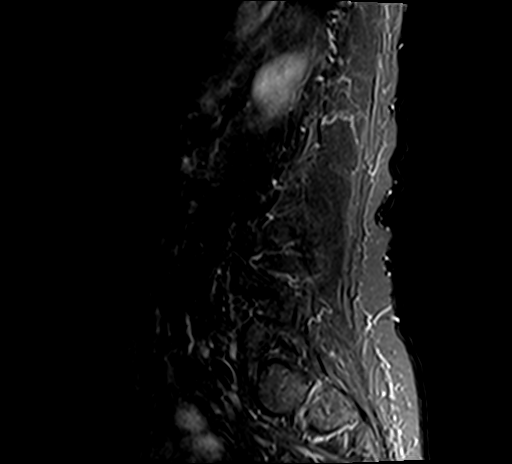
[im 3/12]
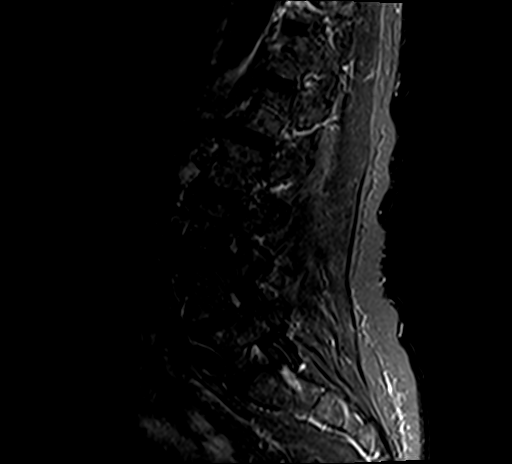

[Series 7: T2 · axial · 4.0mm · 0.74mm/px · z∈[-51,+173]mm · 9 of 32 slices shown]
[im 1/32]
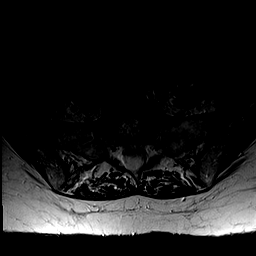
[im 5/32]
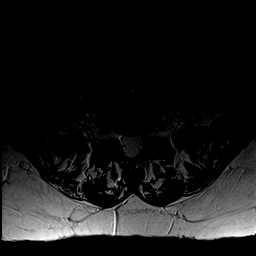
[im 9/32]
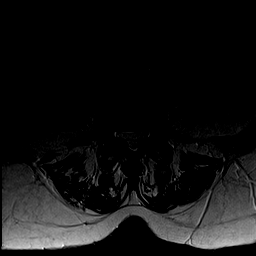
[im 14/32]
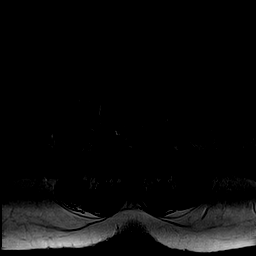
[im 16/32]
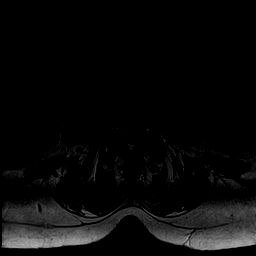
[im 18/32]
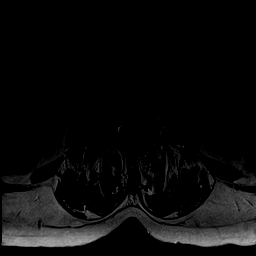
[im 23/32]
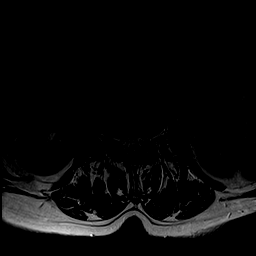
[im 27/32]
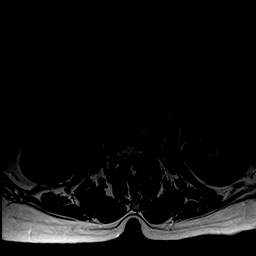
[im 32/32]
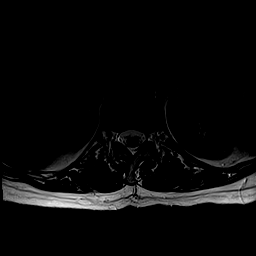

[32 of 48 positions shown; findings below may reference images not displayed]

FINDINGS: Segmentation:  Standard.

Alignment: 2 mm anterolisthesis L4-5 is facet mediated. Compensatory
2 mm retrolisthesis L2-L3 and 2 mm retrolisthesis L1-L2.

Vertebrae:  No worrisome osseous lesion.

Conus medullaris and cauda equina: Conus extends to the L1 level.
Conus and cauda equina appear normal.

Paraspinal and other soft tissues: Unremarkable..

Disc levels:

L1-L2: Disc desiccation. Slight loss of disc height. 2 mm
retrolisthesis. Shallow central protrusion extends slightly more to
the LEFT than RIGHT. No definite impingement.

L2-L3: Disc desiccation. Preserved disc height. 2 mm retrolisthesis.
Shallow protrusion. Facet arthropathy. No impingement.

L3-L4: Good disc height and hydration. Annular bulge. Facet
arthropathy. No impingement.

L4-L5: Disc desiccation and slight loss of disc height. 2 mm
anterolisthesis. Uncovering of the disc with annular bulge.
Posterior element hypertrophy affecting facets and ligamentum
flavum, greater on the LEFT. Borderline subarticular zone narrowing
could affect the L5 nerve root on the LEFT. No compressive foraminal
narrowing.

L5-S1: Good disc height and hydration. Annular bulge. Facet
arthropathy. No impingement.
IMPRESSION: Degenerative anterolisthesis and retrolisthesis as described.

2 mm anterolisthesis L4-5, in conjunction with posterior element
hypertrophy which is greater on the LEFT, could contribute to LEFT
subarticular zone narrowing. Correlate clinically for symptomatic
LEFT L5 neural impingement.

## 2020-01-03 ENCOUNTER — Other Ambulatory Visit: Payer: Self-pay

## 2020-01-03 ENCOUNTER — Other Ambulatory Visit (HOSPITAL_COMMUNITY): Payer: Self-pay | Admitting: Endocrinology

## 2020-01-03 ENCOUNTER — Ambulatory Visit (HOSPITAL_COMMUNITY)
Admission: RE | Admit: 2020-01-03 | Discharge: 2020-01-03 | Disposition: A | Discharge status: Home / Self Care | Payer: Medicare Other | Source: Ambulatory Visit | Attending: Endocrinology | Admitting: Endocrinology

## 2020-01-03 DIAGNOSIS — M79662 Pain in left lower leg: Secondary | ICD-10-CM

## 2020-01-18 ENCOUNTER — Other Ambulatory Visit: Payer: Self-pay | Admitting: Endocrinology

## 2020-01-18 DIAGNOSIS — Z1231 Encounter for screening mammogram for malignant neoplasm of breast: Secondary | ICD-10-CM

## 2020-02-29 ENCOUNTER — Other Ambulatory Visit: Payer: Self-pay

## 2020-02-29 ENCOUNTER — Ambulatory Visit
Admission: RE | Admit: 2020-02-29 | Discharge: 2020-02-29 | Disposition: A | Discharge status: Home / Self Care | Payer: Medicare Other | Source: Ambulatory Visit | Attending: Endocrinology | Admitting: Endocrinology

## 2020-02-29 DIAGNOSIS — Z1231 Encounter for screening mammogram for malignant neoplasm of breast: Secondary | ICD-10-CM

## 2021-03-13 ENCOUNTER — Encounter: Payer: Self-pay | Admitting: Physician Assistant

## 2021-03-25 ENCOUNTER — Other Ambulatory Visit (HOSPITAL_COMMUNITY): Payer: Self-pay | Admitting: *Deleted

## 2021-03-26 ENCOUNTER — Ambulatory Visit (HOSPITAL_COMMUNITY)
Admission: RE | Admit: 2021-03-26 | Discharge: 2021-03-26 | Disposition: A | Discharge status: Home / Self Care | Payer: Medicare Other | Source: Ambulatory Visit | Attending: Endocrinology | Admitting: Endocrinology

## 2021-03-26 ENCOUNTER — Other Ambulatory Visit: Payer: Self-pay

## 2021-03-26 DIAGNOSIS — M810 Age-related osteoporosis without current pathological fracture: Secondary | ICD-10-CM | POA: Insufficient documentation

## 2021-03-26 MED ORDER — ZOLEDRONIC ACID 5 MG/100ML IV SOLN
INTRAVENOUS | Status: AC
  Filled 2021-03-26: qty 100

## 2021-03-26 MED ORDER — ZOLEDRONIC ACID 5 MG/100ML IV SOLN
5.0000 mg | Freq: Once | INTRAVENOUS | Status: AC
  Administered 2021-03-26: 5 mg via INTRAVENOUS
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
## 2021-03-31 ENCOUNTER — Other Ambulatory Visit: Payer: Self-pay

## 2021-03-31 ENCOUNTER — Ambulatory Visit: Payer: Medicare Other | Admitting: Physician Assistant

## 2021-03-31 ENCOUNTER — Encounter: Payer: Self-pay | Admitting: Physician Assistant

## 2021-03-31 VITALS — BP 142/73 | HR 75 | Resp 18 | Ht 65.0 in | Wt 159.0 lb

## 2021-03-31 DIAGNOSIS — R413 Other amnesia: Secondary | ICD-10-CM | POA: Diagnosis not present

## 2021-03-31 NOTE — Patient Instructions (Addendum)
It was a pleasure to see you today at our office.   Recommendations:  Meds: Follow up in 1  month CT head    RECOMMENDATIONS FOR ALL PATIENTS WITH MEMORY PROBLEMS: 1. Continue to exercise (Recommend 30 minutes of walking everyday, or 3 hours every week) 2. Increase social interactions - continue going to [place] and enjoy social gatherings with friends and family 3. Eat healthy, avoid fried foods and eat more fruits and vegetables 4. Maintain adequate blood pressure, blood sugar, and blood cholesterol level. Reducing the risk of stroke and cardiovascular disease also helps promoting better memory. 5. Avoid stressful situations. Live a simple life and avoid aggravations. Organize your time and prepare for the next day in anticipation. 6. Sleep well, avoid any interruptions of sleep and avoid any distractions in the bedroom that may interfere with adequate sleep quality 7. Avoid sugar, avoid sweets as there is a strong link between excessive sugar intake, diabetes, and cognitive impairment We discussed the Mediterranean diet, which has been shown to help patients reduce the risk of progressive memory disorders and reduces cardiovascular risk. This includes eating fish, eat fruits and green leafy vegetables, nuts like almonds and hazelnuts, walnuts, and also use olive oil. Avoid fast foods and fried foods as much as possible. Avoid sweets and sugar as sugar use has been linked to worsening of memory function.  There is always a concern of gradual progression of memory problems. If this is the case, then we may need to adjust level of care according to patient needs. Support, both to the patient and caregiver, should then be put into place.    The [employer] is here all day, every day for people facing Alzheimers disease through our free 24/7 Helpline: [phone]. The Helpline provides reliable information and support to all those who need assistance, such as individuals living with  memory loss, Alzheimer's or other dementia, caregivers, health care professionals and the public.  Our highly trained and knowledgeable staff can help you with: Understanding memory loss, dementia and Alzheimer's  Medications and other treatment options  General information about aging and brain health  Skills to provide quality care and to find the best care from professionals  Legal, financial and living-arrangement decisions Our Helpline also features: Confidential care consultation provided by master's level clinicians who can help with decision-making support, crisis assistance and education on issues families face every day  Help in a caller's preferred language using our translation service that features more than 200 languages and dialects  Referrals to local community programs, services and ongoing support     FALL PRECAUTIONS: Be cautious when walking. Scan the area for obstacles that may increase the risk of trips and falls. When getting up in the mornings, sit up at the edge of the bed for a few minutes before getting out of bed. Consider elevating the bed at the head end to avoid drop of blood pressure when getting up. Walk always in a well-lit room (use night lights in the walls). Avoid area rugs or power cords from appliances in the middle of the walkways. Use a walker or a cane if necessary and consider physical therapy for balance exercise. Get your eyesight checked regularly.  FINANCIAL OVERSIGHT: Supervision, especially oversight when making financial decisions or transactions is also recommended.  HOME SAFETY: Consider the safety of the kitchen when operating appliances like stoves, microwave oven, and blender. Consider having supervision and share cooking responsibilities until no longer able to participate in those. Accidents with  firearms and other hazards in the house should be identified and addressed as well.   ABILITY TO BE LEFT ALONE: If patient is unable to contact  911 operator, consider using LifeLine, or when the need is there, arrange for someone to stay with patients. Smoking is a fire hazard, consider supervision or cessation. Risk of wandering should be assessed by caregiver and if detected at any point, supervision and safe proof recommendations should be instituted.  MEDICATION SUPERVISION: Inability to self-administer medication needs to be constantly addressed. Implement a mechanism to ensure safe administration of the medications.        Mediterranean Diet A Mediterranean diet refers to food and lifestyle choices that are based on the traditions of countries located on the [employer]. This way of eating has been shown to help prevent certain conditions and improve outcomes for people who have chronic diseases, like kidney disease and heart disease. What are tips for following this plan? Lifestyle  Cook and eat meals together with your family, when possible. Drink enough fluid to keep your urine clear or pale yellow. Be physically active every day. This includes: Aerobic exercise like running or swimming. Leisure activities like gardening, walking, or housework. Get 7-8 hours of sleep each night. If recommended by your health care provider, drink red wine in moderation. This means 1 glass a day for nonpregnant women and 2 glasses a day for men. A glass of wine equals 5 oz (150 mL). Reading food labels  Check the serving size of packaged foods. For foods such as rice and pasta, the serving size refers to the amount of cooked product, not dry. Check the total fat in packaged foods. Avoid foods that have saturated fat or trans fats. Check the ingredients list for added sugars, such as corn syrup. Shopping  At the grocery store, buy most of your food from the areas near the walls of the store. This includes: Fresh fruits and vegetables (produce). Grains, beans, nuts, and seeds. Some of these may be available in unpackaged forms or large  amounts (in bulk). Fresh seafood. Poultry and eggs. Low-fat dairy products. Buy whole ingredients instead of prepackaged foods. Buy fresh fruits and vegetables in-season from local farmers markets. Buy frozen fruits and vegetables in resealable bags. If you do not have access to quality fresh seafood, buy precooked frozen shrimp or canned fish, such as tuna, salmon, or sardines. Buy small amounts of raw or cooked vegetables, salads, or olives from the deli or salad bar at your store. Stock your pantry so you always have certain foods on hand, such as olive oil, canned tuna, canned tomatoes, rice, pasta, and beans. Cooking  Cook foods with extra-virgin olive oil instead of using butter or other vegetable oils. Have meat as a side dish, and have vegetables or grains as your main dish. This means having meat in small portions or adding small amounts of meat to foods like pasta or stew. Use beans or vegetables instead of meat in common dishes like chili or lasagna. Experiment with different cooking methods. Try roasting or broiling vegetables instead of steaming or sauteing them. Add frozen vegetables to soups, stews, pasta, or rice. Add nuts or seeds for added healthy fat at each meal. You can add these to yogurt, salads, or vegetable dishes. Marinate fish or vegetables using olive oil, lemon juice, garlic, and fresh herbs. Meal planning  Plan to eat 1 vegetarian meal one day each week. Try to work up to 2 vegetarian meals, if possible.  Eat seafood 2 or more times a week. Have healthy snacks readily available, such as: Vegetable sticks with hummus. Greek yogurt. Fruit and nut trail mix. Eat balanced meals throughout the week. This includes: Fruit: 2-3 servings a day Vegetables: 4-5 servings a day Low-fat dairy: 2 servings a day Fish, poultry, or lean meat: 1 serving a day Beans and legumes: 2 or more servings a week Nuts and seeds: 1-2 servings a day Whole grains: 6-8 servings a  day Extra-virgin olive oil: 3-4 servings a day Limit red meat and sweets to only a few servings a month What are my food choices? Mediterranean diet Recommended Grains: Whole-grain pasta. Brown rice. Bulgar wheat. Polenta. Couscous. Whole-wheat bread. [place]. Vegetables: Artichokes. Beets. Broccoli. Cabbage. Carrots. Eggplant. Green beans. Chard. Kale. Spinach. Onions. Leeks. Peas. Squash. Tomatoes. Peppers. Radishes. Fruits: Apples. Apricots. Avocado. Berries. Bananas. Cherries. Dates. Figs. Grapes. Lemons. Melon. Oranges. Peaches. Plums. Pomegranate. Meats and other protein foods: Beans. Almonds. Sunflower seeds. Pine nuts. Peanuts. [place]. Salmon. Scallops. Shrimp. [place]. Tilapia. Clams. Oysters. Eggs. Dairy: Low-fat milk. Cheese. Greek yogurt. Beverages: Water. Red wine. Herbal tea. Fats and oils: Extra virgin olive oil. Avocado oil. Grape seed oil. Sweets and desserts: [place] yogurt with honey. Baked apples. Poached pears. Trail mix. Seasoning and other foods: Basil. Cilantro. Coriander. Cumin. Mint. Parsley. Sage. Rosemary. Tarragon. Garlic. Oregano. Thyme. Pepper. Balsalmic vinegar. Tahini. Hummus. Tomato sauce. Olives. Mushrooms. Limit these Grains: Prepackaged pasta or rice dishes. Prepackaged cereal with added sugar. Vegetables: Deep fried potatoes (french fries). Fruits: Fruit canned in syrup. Meats and other protein foods: Beef. Pork. Lamb. Poultry with skin. Hot dogs. Berniece Salines. Dairy: Ice cream. Sour cream. Whole milk. Beverages: Juice. Sugar-sweetened soft drinks. Beer. Liquor and spirits. Fats and oils: Butter. Canola oil. Vegetable oil. Beef fat (tallow). Lard. Sweets and desserts: Cookies. Cakes. Pies. Candy. Seasoning and other foods: Mayonnaise. Premade sauces and marinades. The items listed may not be a complete list. Talk with your dietitian about what dietary choices are right for you. Summary The Mediterranean diet includes both food and lifestyle choices. Eat a  variety of fresh fruits and vegetables, beans, nuts, seeds, and whole grains. Limit the amount of red meat and sweets that you eat. Talk with your health care provider about whether it is safe for you to drink red wine in moderation. This means 1 glass a day for nonpregnant women and 2 glasses a day for men. A glass of wine equals 5 oz (150 mL). This information is not intended to replace advice given to you by your health care provider. Make sure you discuss any questions you have with your health care provider. Document Released: 09/11/2015 Document Revised: 10/14/2015 Document Reviewed: 09/11/2015 Elsevier Interactive Patient Education  2017 [employer]. We have sent a referral to [facility] for your CT and they will call you directly to schedule your appointment. They are located at [place]. If you need to contact them directly please call [phone].

## 2021-04-06 ENCOUNTER — Other Ambulatory Visit: Payer: Self-pay | Admitting: Endocrinology

## 2021-04-06 DIAGNOSIS — Z1231 Encounter for screening mammogram for malignant neoplasm of breast: Secondary | ICD-10-CM

## 2021-04-10 ENCOUNTER — Encounter: Payer: Self-pay | Admitting: Physician Assistant

## 2021-04-29 ENCOUNTER — Ambulatory Visit
Admission: RE | Admit: 2021-04-29 | Discharge: 2021-04-29 | Disposition: A | Discharge status: Home / Self Care | Payer: Medicare Other | Source: Ambulatory Visit | Attending: Endocrinology | Admitting: Endocrinology

## 2021-04-29 ENCOUNTER — Ambulatory Visit: Payer: Medicare Other | Admitting: Physician Assistant

## 2021-04-29 ENCOUNTER — Ambulatory Visit: Payer: Medicare Other

## 2021-04-29 DIAGNOSIS — Z1231 Encounter for screening mammogram for malignant neoplasm of breast: Secondary | ICD-10-CM

## 2021-05-05 ENCOUNTER — Ambulatory Visit
Admission: RE | Admit: 2021-05-05 | Discharge: 2021-05-05 | Disposition: A | Discharge status: Home / Self Care | Payer: Medicare Other | Source: Ambulatory Visit | Attending: Physician Assistant | Admitting: Physician Assistant

## 2021-05-07 ENCOUNTER — Telehealth: Payer: Self-pay

## 2021-05-07 NOTE — Telephone Encounter (Signed)
Spoke with pt daughter informed her CT shows age related changes but nothing concerning  ?

## 2021-05-07 NOTE — Telephone Encounter (Signed)
-----   Message from Pieter Partridge, DO sent at 05/07/2021 11:58 AM EDT ----- ?CT shows age related changes but nothing concerning ?

## 2021-05-18 ENCOUNTER — Ambulatory Visit: Payer: Medicare Other | Admitting: Physician Assistant

## 2021-05-18 ENCOUNTER — Encounter: Payer: Self-pay | Admitting: Physician Assistant

## 2021-05-18 VITALS — BP 136/71 | HR 95 | Resp 20 | Ht 65.0 in | Wt 163.0 lb

## 2021-05-18 DIAGNOSIS — F0280 Dementia in other diseases classified elsewhere without behavioral disturbance: Secondary | ICD-10-CM

## 2021-05-18 DIAGNOSIS — G309 Alzheimer's disease, unspecified: Secondary | ICD-10-CM | POA: Diagnosis not present

## 2021-05-18 MED ORDER — MEMANTINE HCL 10 MG PO TABS: ORAL_TABLET | ORAL | 11 refills | Status: DC
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
## 2021-05-18 NOTE — Patient Instructions (Addendum)
It was a pleasure to see you today at our office.  ? ?Recommendations: ?Feel free to visit Facebook page " [employer]" for tips of how to care for people with memory problems.  ?Start Memantine '10mg'$  tablets.  Take 1 tablet at bedtime for 2 weeks, then 1 tablet twice daily.   Side effects include dizziness, headache, diarrhea or constipation.  Call if severe symptoms appear  ?Follow up in 3 months ? ? ?RECOMMENDATIONS FOR ALL PATIENTS WITH MEMORY PROBLEMS: ?1. Continue to exercise (Recommend 30 minutes of walking everyday, or 3 hours every week) ?2. Increase social interactions - continue going to [place] and enjoy social gatherings with friends and family ?3. Eat healthy, avoid fried foods and eat more fruits and vegetables ?4. Maintain adequate blood pressure, blood sugar, and blood cholesterol level. Reducing the risk of stroke and cardiovascular disease also helps promoting better memory. ?5. Avoid stressful situations. Live a simple life and avoid aggravations. Organize your time and prepare for the next day in anticipation. ?6. Sleep well, avoid any interruptions of sleep and avoid any distractions in the bedroom that may interfere with adequate sleep quality ?7. Avoid sugar, avoid sweets as there is a strong link between excessive sugar intake, diabetes, and cognitive impairment ?We discussed the Mediterranean diet, which has been shown to help patients reduce the risk of progressive memory disorders and reduces cardiovascular risk. This includes eating fish, eat fruits and green leafy vegetables, nuts like almonds and hazelnuts, walnuts, and also use olive oil. Avoid fast foods and fried foods as much as possible. Avoid sweets and sugar as sugar use has been linked to worsening of memory function. ? ?There is always a concern of gradual progression of memory problems. If this is the case, then we may need to adjust level of care according to patient needs. Support, both to the patient and caregiver, should then be  put into place.  ? ? ?The Alzheimer?s Association is here all day, every day for people facing Alzheimer?s disease through our free 24/7 Helpline: 6513589844. The Helpline provides reliable information and support to all those who need assistance, such as individuals living with memory loss, Alzheimer's or other dementia, caregivers, health care professionals and the public.  ?Our highly trained and knowledgeable staff can help you with: ?Understanding memory loss, dementia and Alzheimer's  ?Medications and other treatment options  ?General information about aging and brain health  ?Skills to provide quality care and to find the best care from professionals  ?Legal, financial and living-arrangement decisions ?Our Helpline also features: ?Confidential care consultation provided by master's level clinicians who can help with decision-making support, crisis assistance and education on issues families face every day  ?Help in a caller's preferred language using our translation service that features more than 200 languages and dialects  ?Referrals to local community programs, services and ongoing support ? ? ? ? ?FALL PRECAUTIONS: Be cautious when walking. Scan the area for obstacles that may increase the risk of trips and falls. When getting up in the mornings, sit up at the edge of the bed for a few minutes before getting out of bed. Consider elevating the bed at the head end to avoid drop of blood pressure when getting up. Walk always in a well-lit room (use night lights in the walls). Avoid area rugs or power cords from appliances in the middle of the walkways. Use a walker or a cane if necessary and consider physical therapy for balance exercise. Get your eyesight checked regularly. ? ?  FINANCIAL OVERSIGHT: Supervision, especially oversight when making financial decisions or transactions is also recommended. ? ?HOME SAFETY: Consider the safety of the kitchen when operating appliances like stoves, microwave oven,  and blender. Consider having supervision and share cooking responsibilities until no longer able to participate in those. Accidents with firearms and other hazards in the house should be identified and addressed as well. ? ? ?ABILITY TO BE LEFT ALONE: If patient is unable to contact 911 operator, consider using LifeLine, or when the need is there, arrange for someone to stay with patients. Smoking is a fire hazard, consider supervision or cessation. Risk of wandering should be assessed by caregiver and if detected at any point, supervision and safe proof recommendations should be instituted. ? ?MEDICATION SUPERVISION: Inability to self-administer medication needs to be constantly addressed. Implement a mechanism to ensure safe administration of the medications. ? ? ? ?  ? ? ?Mediterranean Diet ?A Mediterranean diet refers to food and lifestyle choices that are based on the traditions of countries located on the [employer]. This way of eating has been shown to help prevent certain conditions and improve outcomes for people who have chronic diseases, like kidney disease and heart disease. ?What are tips for following this plan? ?Lifestyle  ?Cook and eat meals together with your family, when possible. ?Drink enough fluid to keep your urine clear or pale yellow. ?Be physically active every day. This includes: ?Aerobic exercise like running or swimming. ?Leisure activities like gardening, walking, or housework. ?Get 7-8 hours of sleep each night. ?If recommended by your health care provider, drink red wine in moderation. This means 1 glass a day for nonpregnant women and 2 glasses a day for men. A glass of wine equals 5 oz (150 mL). ?Reading food labels  ?Check the serving size of packaged foods. For foods such as rice and pasta, the serving size refers to the amount of cooked product, not dry. ?Check the total fat in packaged foods. Avoid foods that have saturated fat or trans fats. ?Check the ingredients list  for added sugars, such as corn syrup. ?Shopping  ?At the grocery store, buy most of your food from the areas near the walls of the store. This includes: ?Fresh fruits and vegetables (produce). ?Grains, beans, nuts, and seeds. Some of these may be available in unpackaged forms or large amounts (in bulk). ?Fresh seafood. ?Poultry and eggs. ?Low-fat dairy products. ?Buy whole ingredients instead of prepackaged foods. ?Buy fresh fruits and vegetables in-season from local farmers markets. ?Buy frozen fruits and vegetables in resealable bags. ?If you do not have access to quality fresh seafood, buy precooked frozen shrimp or canned fish, such as tuna, salmon, or sardines. ?Buy small amounts of raw or cooked vegetables, salads, or olives from the deli or salad bar at your store. ?Stock your pantry so you always have certain foods on hand, such as olive oil, canned tuna, canned tomatoes, rice, pasta, and beans. ?Cooking  ?Cook foods with extra-virgin olive oil instead of using butter or other vegetable oils. ?Have meat as a side dish, and have vegetables or grains as your main dish. This means having meat in small portions or adding small amounts of meat to foods like pasta or stew. ?Use beans or vegetables instead of meat in common dishes like chili or lasagna. ?Experiment with different cooking methods. Try roasting or broiling vegetables instead of steaming or saut?eing them. ?Add frozen vegetables to soups, stews, pasta, or rice. ?Add nuts or seeds for added healthy fat  at each meal. You can add these to yogurt, salads, or vegetable dishes. ?Marinate fish or vegetables using olive oil, lemon juice, garlic, and fresh herbs. ?Meal planning  ?Plan to eat 1 vegetarian meal one day each week. Try to work up to 2 vegetarian meals, if possible. ?Eat seafood 2 or more times a week. ?Have healthy snacks readily available, such as: ?Vegetable sticks with hummus. ?[place] yogurt. ?Fruit and nut trail mix. ?Eat balanced meals  throughout the week. This includes: ?Fruit: 2-3 servings a day ?Vegetables: 4-5 servings a day ?Low-fat dairy: 2 servings a day ?Fish, poultry, or lean meat: 1 serving a day ?Beans and legumes: 2 or more serving

## 2021-08-20 ENCOUNTER — Ambulatory Visit: Payer: Medicare Other | Admitting: Physician Assistant

## 2021-08-20 ENCOUNTER — Encounter: Payer: Self-pay | Admitting: Physician Assistant

## 2021-08-20 VITALS — BP 131/77 | HR 75 | Resp 20 | Wt 171.0 lb

## 2021-08-20 DIAGNOSIS — F0280 Dementia in other diseases classified elsewhere without behavioral disturbance: Secondary | ICD-10-CM | POA: Diagnosis not present

## 2021-08-20 DIAGNOSIS — G309 Alzheimer's disease, unspecified: Secondary | ICD-10-CM | POA: Diagnosis not present

## 2021-08-20 MED ORDER — MEMANTINE HCL 10 MG PO TABS: ORAL_TABLET | ORAL | 11 refills | Status: DC
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
## 2021-08-20 NOTE — Patient Instructions (Signed)
It was a pleasure to see you today at our office.   Recommendations:  Follow up in 6  months  Continue Memantine 10 mg twice daily.Side effects were discussed   Whom to call:  Memory  decline, memory medications: Call our office [phone]   For psychiatric meds, mood meds: Please have your primary care physician manage these medications.   Counseling regarding caregiver distress, including caregiver depression, anxiety and issues regarding community resources, adult day care programs, adult living facilities, or memory care questions:   Feel free to contact Misty Taylor Palladino, Social Worker at [phone]   For assessment of decision of mental capacity and competency:  Call Dr. Michelle Haber, geriatric psychiatrist at [phone]  For guidance in geriatric dementia issues please call [facility] [phone]  For guidance regarding [facility] and if placement were needed at the facility, contact Nicole Reynolds, Social Worker tel: [phone]  If you have any severe symptoms of a stroke, or other severe issues such as confusion,severe chills or fever, etc call 911 or go to the ER as you may need to be evaluated further   Feel free to visit Facebook page " [email or web address]" for tips of how to care for people with memory problems.       RECOMMENDATIONS FOR ALL PATIENTS WITH MEMORY PROBLEMS: 1. Continue to exercise (Recommend 30 minutes of walking everyday, or 3 hours every week) 2. Increase social interactions - continue going to Church and enjoy social gatherings with friends and family 3. Eat healthy, avoid fried foods and eat more fruits and vegetables 4. Maintain adequate blood pressure, blood sugar, and blood cholesterol level. Reducing the risk of stroke and cardiovascular disease also helps promoting better memory. 5. Avoid stressful situations. Live a simple life and avoid aggravations. Organize your time and prepare for the next day in  anticipation. 6. Sleep well, avoid any interruptions of sleep and avoid any distractions in the bedroom that may interfere with adequate sleep quality 7. Avoid sugar, avoid sweets as there is a strong link between excessive sugar intake, diabetes, and cognitive impairment We discussed the Mediterranean diet, which has been shown to help patients reduce the risk of progressive memory disorders and reduces cardiovascular risk. This includes eating fish, eat fruits and green leafy vegetables, nuts like almonds and hazelnuts, walnuts, and also use olive oil. Avoid fast foods and fried foods as much as possible. Avoid sweets and sugar as sugar use has been linked to worsening of memory function.  There is always a concern of gradual progression of memory problems. If this is the case, then we may need to adjust level of care according to patient needs. Support, both to the patient and caregiver, should then be put into place.    The Alzheimer's Association is here all day, every day for people facing Alzheimer's disease through our free 24/7 Helpline: [phone]. The Helpline provides reliable information and support to all those who need assistance, such as individuals living with memory loss, Alzheimer's or other dementia, caregivers, health care professionals and the public.  Our highly trained and knowledgeable staff can help you with: Understanding memory loss, dementia and Alzheimer's  Medications and other treatment options  General information about aging and brain health  Skills to provide quality care and to find the best care from professionals  Legal, financial and living-arrangement decisions Our Helpline also features: Confidential care consultation provided by master's level clinicians who can help with decision-making support, crisis assistance and education on   issues families face every day  Help in a caller's preferred language using our translation service that features more than 200  languages and dialects  Referrals to local community programs, services and ongoing support     FALL PRECAUTIONS: Be cautious when walking. Scan the area for obstacles that may increase the risk of trips and falls. When getting up in the mornings, sit up at the edge of the bed for a few minutes before getting out of bed. Consider elevating the bed at the head end to avoid drop of blood pressure when getting up. Walk always in a well-lit room (use night lights in the walls). Avoid area rugs or power cords from appliances in the middle of the walkways. Use a walker or a cane if necessary and consider physical therapy for balance exercise. Get your eyesight checked regularly.  FINANCIAL OVERSIGHT: Supervision, especially oversight when making financial decisions or transactions is also recommended.  HOME SAFETY: Consider the safety of the kitchen when operating appliances like stoves, microwave oven, and blender. Consider having supervision and share cooking responsibilities until no longer able to participate in those. Accidents with firearms and other hazards in the house should be identified and addressed as well.   ABILITY TO BE LEFT ALONE: If patient is unable to contact 911 operator, consider using LifeLine, or when the need is there, arrange for someone to stay with patients. Smoking is a fire hazard, consider supervision or cessation. Risk of wandering should be assessed by caregiver and if detected at any point, supervision and safe proof recommendations should be instituted.  MEDICATION SUPERVISION: Inability to self-administer medication needs to be constantly addressed. Implement a mechanism to ensure safe administration of the medications.   DRIVING: Regarding driving, in patients with progressive memory problems, driving will be impaired. We advise to have someone else do the driving if trouble finding directions or if minor accidents are reported. Independent driving assessment is  available to determine safety of driving.   If you are interested in the driving assessment, you can contact the following:  [employer] in [place] [phone]  [facility] [phone]  [facility] [phone]  [facility] [phone] or [phone]      Mediterranean Diet A Mediterranean diet refers to food and lifestyle choices that are based on the traditions of countries located on the [place]. This way of eating has been shown to help prevent certain conditions and improve outcomes for people who have chronic diseases, like kidney disease and heart disease. What are tips for following this plan? Lifestyle  Cook and eat meals together with your family, when possible. Drink enough fluid to keep your urine clear or pale yellow. Be physically active every day. This includes: Aerobic exercise like running or swimming. Leisure activities like gardening, walking, or housework. Get 7-8 hours of sleep each night. If recommended by your health care provider, drink red wine in moderation. This means 1 glass a day for nonpregnant women and 2 glasses a day for men. A glass of wine equals 5 oz (150 mL). Reading food labels  Check the serving size of packaged foods. For foods such as rice and pasta, the serving size refers to the amount of cooked product, not dry. Check the total fat in packaged foods. Avoid foods that have saturated fat or trans fats. Check the ingredients list for added sugars, such as corn syrup. Shopping  At the grocery store, buy most of your food from the areas near the   walls of the store. This includes: Fresh fruits and vegetables (produce). Grains, beans, nuts, and seeds. Some of these may be available in unpackaged forms or large amounts (in bulk). Fresh seafood. Poultry and eggs. Low-fat dairy products. Buy whole ingredients instead of prepackaged foods. Buy fresh fruits and vegetables in-season  from local farmers markets. Buy frozen fruits and vegetables in resealable bags. If you do not have access to quality fresh seafood, buy precooked frozen shrimp or canned fish, such as tuna, salmon, or sardines. Buy small amounts of raw or cooked vegetables, salads, or olives from the deli or salad bar at your store. Stock your pantry so you always have certain foods on hand, such as olive oil, canned tuna, canned tomatoes, rice, pasta, and beans. Cooking  Cook foods with extra-virgin olive oil instead of using butter or other vegetable oils. Have meat as a side dish, and have vegetables or grains as your main dish. This means having meat in small portions or adding small amounts of meat to foods like pasta or stew. Use beans or vegetables instead of meat in common dishes like chili or lasagna. Experiment with different cooking methods. Try roasting or broiling vegetables instead of steaming or sauteing them. Add frozen vegetables to soups, stews, pasta, or rice. Add nuts or seeds for added healthy fat at each meal. You can add these to yogurt, salads, or vegetable dishes. Marinate fish or vegetables using olive oil, lemon juice, garlic, and fresh herbs. Meal planning  Plan to eat 1 vegetarian meal one day each week. Try to work up to 2 vegetarian meals, if possible. Eat seafood 2 or more times a week. Have healthy snacks readily available, such as: Vegetable sticks with hummus. Greek yogurt. Fruit and nut trail mix. Eat balanced meals throughout the week. This includes: Fruit: 2-3 servings a day Vegetables: 4-5 servings a day Low-fat dairy: 2 servings a day Fish, poultry, or lean meat: 1 serving a day Beans and legumes: 2 or more servings a week Nuts and seeds: 1-2 servings a day Whole grains: 6-8 servings a day Extra-virgin olive oil: 3-4 servings a day Limit red meat and sweets to only a few servings a month What are my food choices? Mediterranean diet Recommended Grains:  Whole-grain pasta. Brown rice. Bulgar wheat. Polenta. Couscous. Whole-wheat bread. Oatmeal. Quinoa. Vegetables: Artichokes. Beets. Broccoli. Cabbage. Carrots. Eggplant. Green beans. Chard. Kale. Spinach. Onions. Leeks. Peas. Squash. Tomatoes. Peppers. Radishes. Fruits: Apples. Apricots. Avocado. Berries. Bananas. Cherries. Dates. Figs. Grapes. Lemons. Melon. Oranges. Peaches. Plums. Pomegranate. Meats and other protein foods: Beans. Almonds. Sunflower seeds. Pine nuts. Peanuts. Cod. Salmon. Scallops. Shrimp. Tuna. Tilapia. Clams. Oysters. Eggs. Dairy: Low-fat milk. Cheese. Greek yogurt. Beverages: Water. Red wine. Herbal tea. Fats and oils: Extra virgin olive oil. Avocado oil. Grape seed oil. Sweets and desserts: Greek yogurt with honey. Baked apples. Poached pears. Trail mix. Seasoning and other foods: Basil. Cilantro. Coriander. Cumin. Mint. Parsley. Sage. Rosemary. Tarragon. Garlic. Oregano. Thyme. Pepper. Balsalmic vinegar. Tahini. Hummus. Tomato sauce. Olives. Mushrooms. Limit these Grains: Prepackaged pasta or rice dishes. Prepackaged cereal with added sugar. Vegetables: Deep fried potatoes (french fries). Fruits: Fruit canned in syrup. Meats and other protein foods: Beef. Pork. Lamb. Poultry with skin. Hot dogs. Bacon. Dairy: Ice cream. Sour cream. Whole milk. Beverages: Juice. Sugar-sweetened soft drinks. Beer. Liquor and spirits. Fats and oils: Butter. Canola oil. Vegetable oil. Beef fat (tallow). Lard. Sweets and desserts: Cookies. Cakes. Pies. Candy. Seasoning and other foods: Mayonnaise. Premade sauces and marinades. The items   listed may not be a complete list. Talk with your dietitian about what dietary choices are right for you. Summary The Mediterranean diet includes both food and lifestyle choices. Eat a variety of fresh fruits and vegetables, beans, nuts, seeds, and whole grains. Limit the amount of red meat and sweets that you eat. Talk with your health care provider about  whether it is safe for you to drink red wine in moderation. This means 1 glass a day for nonpregnant women and 2 glasses a day for men. A glass of wine equals 5 oz (150 mL). This information is not intended to replace advice given to you by your health care provider. Make sure you discuss any questions you have with your health care provider. Document Released: 09/11/2015 Document Revised: 10/14/2015 Document Reviewed: 09/11/2015 Elsevier Interactive Patient Education  2017 Elsevier Inc.     

## 2021-12-14 ENCOUNTER — Encounter (INDEPENDENT_AMBULATORY_CARE_PROVIDER_SITE_OTHER): Payer: Self-pay | Admitting: Gastroenterology

## 2022-03-01 ENCOUNTER — Encounter: Payer: Self-pay | Admitting: Physician Assistant

## 2022-03-01 ENCOUNTER — Ambulatory Visit: Payer: Medicare Other | Admitting: Physician Assistant

## 2022-03-01 VITALS — BP 151/85 | HR 81 | Resp 20 | Wt 177.0 lb

## 2022-03-01 DIAGNOSIS — G309 Alzheimer's disease, unspecified: Secondary | ICD-10-CM | POA: Diagnosis not present

## 2022-03-01 DIAGNOSIS — F0280 Dementia in other diseases classified elsewhere without behavioral disturbance: Secondary | ICD-10-CM | POA: Diagnosis not present
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
## 2022-03-01 NOTE — Patient Instructions (Signed)
It was a pleasure to see you today at our office.   Recommendations:  Follow up in 6  months Continue Memantine 10 mg twice daily.Side effects were discussed  Use a cane or walker for stability Drink more water   For assessment of decision of mental capacity and competency:  Call Dr. Anthoney Harada, geriatric psychiatrist at [phone] Whom to call:  Memory  decline, memory medications: Call our office [phone]   For psychiatric meds, mood meds: Please have your primary care physician manage these medications.       For guidance in geriatric dementia issues please call [facility] [phone]  For guidance regarding [facility] and if placement were needed at the facility, contact Arnell Asal, Social Worker tel: [phone]  If you have any severe symptoms of a stroke, or other severe issues such as confusion,severe chills or fever, etc call 911 or go to the ER as you may need to be evaluated further   Feel free to visit Facebook page " [email or web address]" for tips of how to care for people with memory problems.     RECOMMENDATIONS FOR ALL PATIENTS WITH MEMORY PROBLEMS: 1. Continue to exercise (Recommend 30 minutes of walking everyday, or 3 hours every week) 2. Increase social interactions - continue going to [place] and enjoy social gatherings with friends and family 3. Eat healthy, avoid fried foods and eat more fruits and vegetables 4. Maintain adequate blood pressure, blood sugar, and blood cholesterol level. Reducing the risk of stroke and cardiovascular disease also helps promoting better memory. 5. Avoid stressful situations. Live a simple life and avoid aggravations. Organize your time and prepare for the next day in anticipation. 6. Sleep well, avoid any interruptions of sleep and avoid any distractions in the bedroom that may interfere with adequate sleep quality 7. Avoid sugar, avoid sweets as there is a strong link between excessive sugar  intake, diabetes, and cognitive impairment We discussed the Mediterranean diet, which has been shown to help patients reduce the risk of progressive memory disorders and reduces cardiovascular risk. This includes eating fish, eat fruits and green leafy vegetables, nuts like almonds and hazelnuts, walnuts, and also use olive oil. Avoid fast foods and fried foods as much as possible. Avoid sweets and sugar as sugar use has been linked to worsening of memory function.  There is always a concern of gradual progression of memory problems. If this is the case, then we may need to adjust level of care according to patient needs. Support, both to the patient and caregiver, should then be put into place.    The [employer] Association is here all day, every day for people facing Alzheimer's disease through our free 24/7 Helpline: 239-022-8424. The Helpline provides reliable information and support to all those who need assistance, such as individuals living with memory loss, Alzheimer's or other dementia, caregivers, health care professionals and the public.  Our highly trained and knowledgeable staff can help you with: Understanding memory loss, dementia and Alzheimer's  Medications and other treatment options  General information about aging and brain health  Skills to provide quality care and to find the best care from professionals  Legal, financial and living-arrangement decisions Our Helpline also features: Confidential care consultation provided by master's level clinicians who can help with decision-making support, crisis assistance and education on issues families face every day  Help in a caller's preferred language using our translation service that features more than 200 languages and dialects  Referrals to local  community programs, services and ongoing support     FALL PRECAUTIONS: Be cautious when walking. Scan the area for obstacles that may increase the risk of trips and falls. When  getting up in the mornings, sit up at the edge of the bed for a few minutes before getting out of bed. Consider elevating the bed at the head end to avoid drop of blood pressure when getting up. Walk always in a well-lit room (use night lights in the walls). Avoid area rugs or power cords from appliances in the middle of the walkways. Use a walker or a cane if necessary and consider physical therapy for balance exercise. Get your eyesight checked regularly.  FINANCIAL OVERSIGHT: Supervision, especially oversight when making financial decisions or transactions is also recommended.  HOME SAFETY: Consider the safety of the kitchen when operating appliances like stoves, microwave oven, and blender. Consider having supervision and share cooking responsibilities until no longer able to participate in those. Accidents with firearms and other hazards in the house should be identified and addressed as well.   ABILITY TO BE LEFT ALONE: If patient is unable to contact 911 operator, consider using LifeLine, or when the need is there, arrange for someone to stay with patients. Smoking is a fire hazard, consider supervision or cessation. Risk of wandering should be assessed by caregiver and if detected at any point, supervision and safe proof recommendations should be instituted.  MEDICATION SUPERVISION: Inability to self-administer medication needs to be constantly addressed. Implement a mechanism to ensure safe administration of the medications.   DRIVING: Regarding driving, in patients with progressive memory problems, driving will be impaired. We advise to have someone else do the driving if trouble finding directions or if minor accidents are reported. Independent driving assessment is available to determine safety of driving.   If you are interested in the driving assessment, you can contact the following:  [employer] in [place]  [place]  [place] [phone] or [phone]      [place] refers to food and lifestyle choices that are based on the traditions of countries located on the [employer]. This way of eating has been shown to help prevent certain conditions and improve outcomes for people who have chronic diseases, like kidney disease and heart disease. What are tips for following this plan? Lifestyle  Cook and eat meals together with your family, when possible. Drink enough fluid to keep your urine clear or pale yellow. Be physically active every day. This includes: Aerobic exercise like running or swimming. Leisure activities like gardening, walking, or housework. Get 7-8 hours of sleep each night. If recommended by your health care provider, drink red wine in moderation. This means 1 glass a day for nonpregnant women and 2 glasses a day for men. A glass of wine equals 5 oz (150 mL). Reading food labels  Check the serving size of packaged foods. For foods such as rice and pasta, the serving size refers to the amount of cooked product, not dry. Check the total fat in packaged foods. Avoid foods that have saturated fat or trans fats. Check the ingredients list for added sugars, such as corn syrup. Shopping  At the grocery store, buy most of your food from the areas near the walls of the store. This includes: Fresh fruits and vegetables (produce). Grains, beans, nuts, and seeds. Some of these may be available in unpackaged forms or large amounts (  in bulk). Fresh seafood. Poultry and eggs. Low-fat dairy products. Buy whole ingredients instead of prepackaged foods. Buy fresh fruits and vegetables in-season from local farmers markets. Buy frozen fruits and vegetables in resealable bags. If you do not have access to quality fresh seafood, buy precooked frozen shrimp or canned fish, such as tuna, salmon, or sardines. Buy  small amounts of raw or cooked vegetables, salads, or olives from the deli or salad bar at your store. Stock your pantry so you always have certain foods on hand, such as olive oil, canned tuna, canned tomatoes, rice, pasta, and beans. Cooking  Cook foods with extra-virgin olive oil instead of using butter or other vegetable oils. Have meat as a side dish, and have vegetables or grains as your main dish. This means having meat in small portions or adding small amounts of meat to foods like pasta or stew. Use beans or vegetables instead of meat in common dishes like chili or lasagna. Experiment with different cooking methods. Try roasting or broiling vegetables instead of steaming or sauteing them. Add frozen vegetables to soups, stews, pasta, or rice. Add nuts or seeds for added healthy fat at each meal. You can add these to yogurt, salads, or vegetable dishes. Marinate fish or vegetables using olive oil, lemon juice, garlic, and fresh herbs. Meal planning  Plan to eat 1 vegetarian meal one day each week. Try to work up to 2 vegetarian meals, if possible. Eat seafood 2 or more times a week. Have healthy snacks readily available, such as: Vegetable sticks with hummus. Greek yogurt. Fruit and nut trail mix. Eat balanced meals throughout the week. This includes: Fruit: 2-3 servings a day Vegetables: 4-5 servings a day Low-fat dairy: 2 servings a day Fish, poultry, or lean meat: 1 serving a day Beans and legumes: 2 or more servings a week Nuts and seeds: 1-2 servings a day Whole grains: 6-8 servings a day Extra-virgin olive oil: 3-4 servings a day Limit red meat and sweets to only a few servings a month What are my food choices? Mediterranean diet Recommended Grains: Whole-grain pasta. Brown rice. Bulgar wheat. Polenta. Couscous. Whole-wheat bread. [place]. Vegetables: Artichokes. Beets. Broccoli. Cabbage. Carrots. Eggplant. Green beans. Chard. Kale. Spinach. Onions. Leeks. Peas.  Squash. Tomatoes. Peppers. Radishes. Fruits: Apples. Apricots. Avocado. Berries. Bananas. Cherries. Dates. Figs. Grapes. Lemons. Melon. Oranges. Peaches. Plums. Pomegranate. Meats and other protein foods: Beans. Almonds. Sunflower seeds. Pine nuts. Peanuts. [place]. Salmon. Scallops. Shrimp. [place]. Tilapia. Clams. Oysters. Eggs. Dairy: Low-fat milk. Cheese. Greek yogurt. Beverages: Water. Red wine. Herbal tea. Fats and oils: Extra virgin olive oil. Avocado oil. Grape seed oil. Sweets and desserts: [place] yogurt with honey. Baked apples. Poached pears. Trail mix. Seasoning and other foods: Basil. Cilantro. Coriander. Cumin. Mint. Parsley. Sage. Rosemary. Tarragon. Garlic. Oregano. Thyme. Pepper. Balsalmic vinegar. Tahini. Hummus. Tomato sauce. Olives. Mushrooms. Limit these Grains: Prepackaged pasta or rice dishes. Prepackaged cereal with added sugar. Vegetables: Deep fried potatoes (french fries). Fruits: Fruit canned in syrup. Meats and other protein foods: Beef. Pork. Lamb. Poultry with skin. Hot dogs. [place] Salines. Dairy: Ice cream. Sour cream. Whole milk. Beverages: Juice. Sugar-sweetened soft drinks. Beer. Liquor and spirits. Fats and oils: Butter. [place]ola oil. Vegetable oil. Beef fat (tallow). Lard. Sweets and desserts: Cookies. Cakes. Pies. Candy. Seasoning and other foods: Mayonnaise. Premade sauces and marinades. The items listed may not be a complete list. Talk with your dietitian about what dietary choices are right for you. Summary The Mediterranean diet includes both food and lifestyle  choices. Eat a variety of fresh fruits and vegetables, beans, nuts, seeds, and whole grains. Limit the amount of red meat and sweets that you eat. Talk with your health care provider about whether it is safe for you to drink red wine in moderation. This means 1 glass a day for nonpregnant women and 2 glasses a day for men. A glass of wine equals 5 oz (150 mL). This information is not intended to replace advice  given to you by your health care provider. Make sure you discuss any questions you have with your health care provider. Document Released: 09/11/2015 Document Revised: 10/14/2015 Document Reviewed: 09/11/2015 Elsevier Interactive Patient Education  2017 [employer].

## 2022-07-02 ENCOUNTER — Other Ambulatory Visit: Payer: Self-pay | Admitting: Physician Assistant

## 2022-07-02 ENCOUNTER — Encounter: Payer: Self-pay | Admitting: Physician Assistant

## 2022-07-02 NOTE — Telephone Encounter (Signed)
Agree with discontinuing memantine, at certain point, the memory meds are no longer therapeutic. Sorry to hear this.

## 2022-08-30 ENCOUNTER — Ambulatory Visit: Payer: Medicare Other | Admitting: Physician Assistant

## 2022-09-02 ENCOUNTER — Ambulatory Visit: Payer: Medicare Other | Admitting: Physician Assistant

## 2023-11-16 ENCOUNTER — Encounter (INDEPENDENT_AMBULATORY_CARE_PROVIDER_SITE_OTHER): Payer: Self-pay | Admitting: Gastroenterology
